# Patient Record
Sex: Female | Born: 1965 | Race: White | Hispanic: No | Marital: Married | Smoking: Never smoker
Health system: Southern US, Community
[De-identification: ages and names within clinical notes are randomized; demographics above are authoritative.]

## PROBLEM LIST (undated history)

## (undated) DIAGNOSIS — D649 Anemia, unspecified: Secondary | ICD-10-CM

## (undated) DIAGNOSIS — N200 Calculus of kidney: Secondary | ICD-10-CM

## (undated) DIAGNOSIS — Z87442 Personal history of urinary calculi: Secondary | ICD-10-CM

## (undated) DIAGNOSIS — R519 Headache, unspecified: Secondary | ICD-10-CM

## (undated) DIAGNOSIS — J45909 Unspecified asthma, uncomplicated: Secondary | ICD-10-CM

## (undated) DIAGNOSIS — M069 Rheumatoid arthritis, unspecified: Secondary | ICD-10-CM

## (undated) DIAGNOSIS — L4050 Arthropathic psoriasis, unspecified: Secondary | ICD-10-CM

## (undated) DIAGNOSIS — F419 Anxiety disorder, unspecified: Secondary | ICD-10-CM

## (undated) DIAGNOSIS — D472 Monoclonal gammopathy: Secondary | ICD-10-CM

## (undated) HISTORY — PX: ABDOMINAL HYSTERECTOMY: SHX81

## (undated) HISTORY — DX: Calculus of kidney: N20.0

## (undated) HISTORY — DX: Monoclonal gammopathy: D47.2

## (undated) HISTORY — DX: Rheumatoid arthritis, unspecified: M06.9

## (undated) HISTORY — PX: CHOLECYSTECTOMY: SHX55

## (undated) HISTORY — PX: BREAST BIOPSY: SHX20

---

## 2005-04-15 ENCOUNTER — Ambulatory Visit: Payer: Self-pay | Admitting: Unknown Physician Specialty

## 2006-04-16 ENCOUNTER — Ambulatory Visit: Payer: Self-pay | Admitting: Unknown Physician Specialty

## 2006-12-21 ENCOUNTER — Ambulatory Visit: Payer: Self-pay | Admitting: General Surgery

## 2007-04-20 ENCOUNTER — Ambulatory Visit: Payer: Self-pay | Admitting: Unknown Physician Specialty

## 2007-12-04 ENCOUNTER — Emergency Department: Payer: Self-pay | Admitting: Emergency Medicine

## 2008-04-20 ENCOUNTER — Ambulatory Visit: Payer: Self-pay | Admitting: Unknown Physician Specialty

## 2011-11-10 ENCOUNTER — Ambulatory Visit: Payer: Self-pay | Admitting: Internal Medicine

## 2011-11-11 ENCOUNTER — Ambulatory Visit: Payer: Self-pay | Admitting: Internal Medicine

## 2012-06-10 ENCOUNTER — Ambulatory Visit: Payer: Self-pay | Admitting: Internal Medicine

## 2012-10-27 ENCOUNTER — Other Ambulatory Visit: Payer: Self-pay | Admitting: Cardiology

## 2012-12-21 ENCOUNTER — Ambulatory Visit: Payer: Self-pay | Admitting: Internal Medicine

## 2014-01-24 ENCOUNTER — Ambulatory Visit: Payer: Self-pay | Admitting: Internal Medicine

## 2015-05-21 ENCOUNTER — Other Ambulatory Visit: Payer: Self-pay | Admitting: Internal Medicine

## 2015-05-21 DIAGNOSIS — Z1231 Encounter for screening mammogram for malignant neoplasm of breast: Secondary | ICD-10-CM

## 2015-06-05 ENCOUNTER — Ambulatory Visit
Admission: RE | Admit: 2015-06-05 | Discharge: 2015-06-05 | Disposition: A | Discharge status: Home / Self Care | Payer: BC Managed Care – PPO | Source: Ambulatory Visit | Attending: Internal Medicine | Admitting: Internal Medicine

## 2015-06-05 DIAGNOSIS — Z1231 Encounter for screening mammogram for malignant neoplasm of breast: Secondary | ICD-10-CM | POA: Insufficient documentation

## 2016-10-30 ENCOUNTER — Other Ambulatory Visit: Payer: Self-pay | Admitting: Internal Medicine

## 2016-10-30 DIAGNOSIS — Z1231 Encounter for screening mammogram for malignant neoplasm of breast: Secondary | ICD-10-CM

## 2016-11-21 ENCOUNTER — Ambulatory Visit
Admission: RE | Admit: 2016-11-21 | Discharge: 2016-11-21 | Disposition: A | Discharge status: Home / Self Care | Payer: BC Managed Care – PPO | Source: Ambulatory Visit | Attending: Internal Medicine | Admitting: Internal Medicine

## 2016-11-21 DIAGNOSIS — Z1231 Encounter for screening mammogram for malignant neoplasm of breast: Secondary | ICD-10-CM | POA: Diagnosis not present

## 2017-12-24 ENCOUNTER — Other Ambulatory Visit: Payer: Self-pay | Admitting: Internal Medicine

## 2017-12-24 DIAGNOSIS — Z1231 Encounter for screening mammogram for malignant neoplasm of breast: Secondary | ICD-10-CM

## 2019-03-24 ENCOUNTER — Other Ambulatory Visit: Payer: Self-pay | Admitting: Internal Medicine

## 2019-03-24 DIAGNOSIS — Z1231 Encounter for screening mammogram for malignant neoplasm of breast: Secondary | ICD-10-CM

## 2019-05-02 ENCOUNTER — Inpatient Hospital Stay: Admission: RE | Admit: 2019-05-02 | Payer: BC Managed Care – PPO | Source: Ambulatory Visit

## 2019-07-15 ENCOUNTER — Other Ambulatory Visit: Payer: Self-pay | Admitting: Internal Medicine

## 2019-07-15 DIAGNOSIS — Z1231 Encounter for screening mammogram for malignant neoplasm of breast: Secondary | ICD-10-CM

## 2019-07-15 DIAGNOSIS — R102 Pelvic and perineal pain: Secondary | ICD-10-CM

## 2019-07-22 ENCOUNTER — Ambulatory Visit
Admission: RE | Admit: 2019-07-22 | Discharge: 2019-07-22 | Disposition: A | Discharge status: Home / Self Care | Payer: BC Managed Care – PPO | Source: Ambulatory Visit | Attending: Internal Medicine | Admitting: Internal Medicine

## 2019-07-22 ENCOUNTER — Other Ambulatory Visit: Payer: Self-pay

## 2019-07-22 DIAGNOSIS — R102 Pelvic and perineal pain: Secondary | ICD-10-CM

## 2019-08-29 ENCOUNTER — Ambulatory Visit
Admission: RE | Admit: 2019-08-29 | Discharge: 2019-08-29 | Disposition: A | Discharge status: Home / Self Care | Payer: BC Managed Care – PPO | Source: Ambulatory Visit | Attending: Internal Medicine | Admitting: Internal Medicine

## 2019-08-29 DIAGNOSIS — Z1231 Encounter for screening mammogram for malignant neoplasm of breast: Secondary | ICD-10-CM | POA: Diagnosis present

## 2020-01-04 ENCOUNTER — Other Ambulatory Visit: Payer: Self-pay | Admitting: Rheumatology

## 2020-01-04 DIAGNOSIS — G8929 Other chronic pain: Secondary | ICD-10-CM

## 2020-01-04 DIAGNOSIS — M7061 Trochanteric bursitis, right hip: Secondary | ICD-10-CM

## 2020-01-19 ENCOUNTER — Other Ambulatory Visit: Payer: Self-pay

## 2020-01-19 ENCOUNTER — Ambulatory Visit
Admission: RE | Admit: 2020-01-19 | Discharge: 2020-01-19 | Disposition: A | Discharge status: Home / Self Care | Payer: BC Managed Care – PPO | Source: Ambulatory Visit | Attending: Rheumatology | Admitting: Rheumatology

## 2020-01-19 DIAGNOSIS — M545 Low back pain, unspecified: Secondary | ICD-10-CM

## 2020-01-19 DIAGNOSIS — M7061 Trochanteric bursitis, right hip: Secondary | ICD-10-CM

## 2020-01-19 DIAGNOSIS — G8929 Other chronic pain: Secondary | ICD-10-CM

## 2020-02-02 ENCOUNTER — Emergency Department
Admission: EM | Admit: 2020-02-02 | Discharge: 2020-02-02 | Disposition: A | Discharge status: Home / Self Care | Payer: BC Managed Care – PPO | Attending: Emergency Medicine | Admitting: Emergency Medicine

## 2020-02-02 ENCOUNTER — Other Ambulatory Visit: Payer: Self-pay

## 2020-02-02 ENCOUNTER — Encounter: Payer: Self-pay | Admitting: Emergency Medicine

## 2020-02-02 ENCOUNTER — Emergency Department: Payer: BC Managed Care – PPO

## 2020-02-02 DIAGNOSIS — N200 Calculus of kidney: Secondary | ICD-10-CM | POA: Diagnosis not present

## 2020-02-02 DIAGNOSIS — J45909 Unspecified asthma, uncomplicated: Secondary | ICD-10-CM | POA: Insufficient documentation

## 2020-02-02 DIAGNOSIS — R109 Unspecified abdominal pain: Secondary | ICD-10-CM | POA: Diagnosis present

## 2020-02-02 HISTORY — DX: Unspecified asthma, uncomplicated: J45.909

## 2020-02-02 LAB — CBC
HCT: 35.6 % — ABNORMAL LOW (ref 36.0–46.0)
Hemoglobin: 12.5 g/dL (ref 12.0–15.0)
MCH: 35.7 pg — ABNORMAL HIGH (ref 26.0–34.0)
MCHC: 35.1 g/dL (ref 30.0–36.0)
MCV: 101.7 fL — ABNORMAL HIGH (ref 80.0–100.0)
Platelets: 144 10*3/uL — ABNORMAL LOW (ref 150–400)
RBC: 3.5 MIL/uL — ABNORMAL LOW (ref 3.87–5.11)
RDW: 13.9 % (ref 11.5–15.5)
WBC: 7.5 10*3/uL (ref 4.0–10.5)
nRBC: 0 % (ref 0.0–0.2)

## 2020-02-02 LAB — URINALYSIS, COMPLETE (UACMP) WITH MICROSCOPIC
Bilirubin Urine: NEGATIVE
Glucose, UA: NEGATIVE mg/dL
Ketones, ur: 5 mg/dL — AB
Leukocytes,Ua: NEGATIVE
Nitrite: NEGATIVE
Protein, ur: NEGATIVE mg/dL
Specific Gravity, Urine: 1.006 (ref 1.005–1.030)
pH: 6 (ref 5.0–8.0)

## 2020-02-02 LAB — BASIC METABOLIC PANEL
Anion gap: 8 (ref 5–15)
BUN: 17 mg/dL (ref 6–20)
CO2: 26 mmol/L (ref 22–32)
Calcium: 9 mg/dL (ref 8.9–10.3)
Chloride: 103 mmol/L (ref 98–111)
Creatinine, Ser: 0.78 mg/dL (ref 0.44–1.00)
GFR calc Af Amer: >60 mL/min (ref >60)
GFR calc non Af Amer: >60 mL/min (ref >60)
Glucose, Bld: 98 mg/dL (ref 70–99)
Potassium: 4.7 mmol/L (ref 3.5–5.1)
Sodium: 137 mmol/L (ref 135–145)

## 2020-02-02 MED ORDER — OXYCODONE-ACETAMINOPHEN 5-325 MG PO TABS
1.0000 | ORAL_TABLET | Freq: Once | ORAL | Status: AC
  Administered 2020-02-02: 1 via ORAL
  Filled 2020-02-02: qty 1

## 2020-02-02 MED ORDER — ONDANSETRON 4 MG PO TBDP: 4.0000 mg | ORAL_TABLET | Freq: Three times a day (TID) | ORAL | 0 refills | Status: DC | PRN

## 2020-02-02 MED ORDER — OXYCODONE-ACETAMINOPHEN 5-325 MG PO TABS: 1.0000 | ORAL_TABLET | ORAL | 0 refills | Status: AC | PRN

## 2020-02-02 MED ORDER — IBUPROFEN 600 MG PO TABS
600.0000 mg | ORAL_TABLET | Freq: Once | ORAL | Status: AC | PRN
  Administered 2020-02-02: 600 mg via ORAL
  Filled 2020-02-02: qty 1

## 2020-02-02 NOTE — ED Triage Notes (Signed)
Pt in via POV, sent from PCP office due to positive CT scan for obstructing kidney stone.  Pt reports scan was done [facility].    Reports right flank pain x one day, w/ N/V.  Reports hx of same.  Vitals WDL, NAD noted at this time.

## 2020-02-02 NOTE — ED Provider Notes (Signed)
[facility] Emergency Department Provider Note   ____________________________________________   First MD Initiated Contact with Patient 02/02/20 2056     (approximate)  I have reviewed the triage vital signs and the nursing notes.   HISTORY  Chief Complaint Nephrolithiasis    HPI Cassandra Carr is a 54 y.o. female with past medical history of asthma and arthritis who presents to the ED complaining of flank pain.  Patient reports that she started having sharp pain in her right flank radiating towards her groin yesterday.  Pain has been intermittent since then but became severe today.  It was associated with nausea and an episode of vomiting earlier today.  She also noticed a significant amount of blood in her urine earlier today, but she denies any fevers or dysuria.  She has previously been told she has kidney stones, but has never had similar symptoms.  She was seen in her PCPs office and had an outpatient CT scan that showed obstructing nephrolithiasis.  She was subsequently sent to the ED for further evaluation.        Past Medical History:  Diagnosis Date  . Arthritis   . Asthma     There are no problems to display for this patient.   Past Surgical History:  Procedure Laterality Date  . BREAST BIOPSY Left    negative 06/2012    Prior to Admission medications   Medication Sig Start Date End Date Taking? Authorizing Provider  ondansetron (ZOFRAN ODT) 4 MG disintegrating tablet Take 1 tablet (4 mg total) by mouth every 8 (eight) hours as needed for nausea or vomiting. 02/02/20   Blake Divine, MD  oxyCODONE-acetaminophen (PERCOCET) 5-325 MG tablet Take 1 tablet by mouth every 4 (four) hours as needed for severe pain. 02/02/20 02/01/21  Blake Divine, MD    Allergies Patient has no known allergies.  Family History  Problem Relation Age of Onset  . Breast cancer Neg Hx     Social History Social History   Tobacco Use  . Smoking  status: Never Smoker  . Smokeless tobacco: Never Used  Vaping Use  . Vaping Use: Never used  Substance Use Topics  . Alcohol use: Never  . Drug use: Never    Review of Systems  Constitutional: No fever/chills Eyes: No visual changes. ENT: No sore throat. Cardiovascular: Denies chest pain. Respiratory: Denies shortness of breath. Gastrointestinal: Positive for flank and abdominal pain.  Positive for nausea and vomiting.  No diarrhea.  No constipation. Genitourinary: Negative for dysuria. Musculoskeletal: Negative for back pain. Skin: Negative for rash. Neurological: Negative for headaches, focal weakness or numbness.  ____________________________________________   PHYSICAL EXAM:  VITAL SIGNS: ED Triage Vitals  Enc Vitals Group     BP 02/02/20 1644 (!) 148/77     Pulse Rate 02/02/20 1644 100     Resp 02/02/20 1644 17     Temp 02/02/20 1644 98.5 F (36.9 C)     Temp Source 02/02/20 1644 Oral     SpO2 02/02/20 1644 99 %     Weight 02/02/20 1645 130 lb (59 kg)     Height 02/02/20 1645 5\' 1"  (1.549 m)     Head Circumference --      Peak Flow --      Pain Score 02/02/20 1645 8     Pain Loc --      Pain Edu? --      Excl. in [place]? --     Constitutional: Alert and oriented.  Eyes: Conjunctivae are normal. Head: Atraumatic. Nose: No congestion/rhinnorhea. Mouth/Throat: Mucous membranes are moist. Neck: Normal ROM Cardiovascular: Normal rate, regular rhythm. Grossly normal heart sounds. Respiratory: Normal respiratory effort.  No retractions. Lungs CTAB. Gastrointestinal: Soft and nontender.  No CVA tenderness bilaterally.  No distention. Genitourinary: deferred Musculoskeletal: No lower extremity tenderness nor edema. Neurologic:  Normal speech and language. No gross focal neurologic deficits are appreciated. Skin:  Skin is warm, dry and intact. No rash noted. Psychiatric: Mood and affect are normal. Speech and behavior are  normal.  ____________________________________________   LABS (all labs ordered are listed, but only abnormal results are displayed)  Labs Reviewed  CBC - Abnormal; Notable for the following components:      Result Value   RBC 3.50 (*)    HCT 35.6 (*)    MCV 101.7 (*)    MCH 35.7 (*)    Platelets 144 (*)    All other components within normal limits  URINALYSIS, COMPLETE (UACMP) WITH MICROSCOPIC - Abnormal; Notable for the following components:   Color, Urine STRAW (*)    APPearance CLEAR (*)    Hgb urine dipstick LARGE (*)    Ketones, ur 5 (*)    Bacteria, UA RARE (*)    All other components within normal limits  BASIC METABOLIC PANEL     PROCEDURES  Procedure(s) performed (including Critical Care):  Procedures   ____________________________________________   INITIAL IMPRESSION / ASSESSMENT AND PLAN / ED COURSE       54 year old female with past medical history of asthma and arthritis presents to the ED complaining of approximately 24 hours of intermittent right flank pain radiating towards her groin with associated hematuria.  She had outpatient CT scan that reportedly showed obstructing nephrolithiasis, however I am unable to see these images in epic and the office where imaging was done and is now closed.  We will need to repeat CT scan to ensure no other acute pathology requiring urologic intervention.  Lab work thus far is reassuring, UA shows no evidence of infection.  Her pain is improved from earlier today but we will treat some additional ongoing pain with Percocet.  CT scan shows 9 mm right-sided UVJ stone with no other acute findings.  Patient's pain is much improved following dose of Percocet and she is appropriate for discharge home with urology follow-up.  She was counseled that she will need to closely follow-up with urology given the large size of her stone and that she should also return to the ED for any new or worsening symptoms.  Patient agrees with  plan.      ____________________________________________   FINAL CLINICAL IMPRESSION(S) / ED DIAGNOSES  Final diagnoses:  Nephrolithiasis     ED Discharge Orders         Ordered    oxyCODONE-acetaminophen (PERCOCET) 5-325 MG tablet  Every 4 hours PRN     Discontinue  Reprint     02/02/20 2249    ondansetron (ZOFRAN ODT) 4 MG disintegrating tablet  Every 8 hours PRN     Discontinue  Reprint     02/02/20 2249           Note:  This document was prepared using Dragon voice recognition software and may include unintentional dictation errors.   Blake Divine, MD 02/02/20 2250

## 2020-02-02 NOTE — ED Notes (Signed)
Pt requesting medication for headache

## 2020-02-02 NOTE — ED Notes (Signed)
Pt transported to CT ?

## 2020-02-02 NOTE — ED Notes (Signed)
Pt states that she was sent in for an obstructive kidney stone. Patient states having pain on her right side and some nausea and blood in urine. Pt states she also threw up. Patient denies fevers.

## 2020-02-03 ENCOUNTER — Inpatient Hospital Stay
Admission: RE | Admit: 2020-02-03 | Discharge: 2020-02-03 | Disposition: A | Discharge status: Home / Self Care | Payer: BC Managed Care – PPO | Source: Ambulatory Visit

## 2020-02-03 ENCOUNTER — Ambulatory Visit
Admission: RE | Admit: 2020-02-03 | Discharge: 2020-02-03 | Disposition: A | Discharge status: Home / Self Care | Payer: BC Managed Care – PPO | Attending: Urology | Admitting: Urology

## 2020-02-03 ENCOUNTER — Other Ambulatory Visit
Admission: RE | Admit: 2020-02-03 | Discharge: 2020-02-03 | Disposition: A | Discharge status: Home / Self Care | Payer: BC Managed Care – PPO | Source: Ambulatory Visit | Admitting: Urology

## 2020-02-03 ENCOUNTER — Ambulatory Visit (INDEPENDENT_AMBULATORY_CARE_PROVIDER_SITE_OTHER): Payer: BC Managed Care – PPO | Admitting: Physician Assistant

## 2020-02-03 ENCOUNTER — Ambulatory Visit
Admission: RE | Admit: 2020-02-03 | Discharge: 2020-02-03 | Disposition: A | Discharge status: Home / Self Care | Payer: BC Managed Care – PPO | Source: Ambulatory Visit | Attending: Urology | Admitting: Urology

## 2020-02-03 ENCOUNTER — Encounter: Payer: Self-pay | Admitting: Physician Assistant

## 2020-02-03 ENCOUNTER — Other Ambulatory Visit: Payer: Self-pay | Admitting: Urology

## 2020-02-03 ENCOUNTER — Telehealth: Payer: Self-pay | Admitting: Urology

## 2020-02-03 ENCOUNTER — Encounter
Admission: RE | Admit: 2020-02-03 | Discharge: 2020-02-03 | Disposition: A | Discharge status: Home / Self Care | Payer: BC Managed Care – PPO | Source: Ambulatory Visit | Attending: Urology | Admitting: Urology

## 2020-02-03 VITALS — BP 147/80 | HR 88 | Ht 61.0 in

## 2020-02-03 DIAGNOSIS — Z01812 Encounter for preprocedural laboratory examination: Secondary | ICD-10-CM | POA: Diagnosis not present

## 2020-02-03 DIAGNOSIS — N201 Calculus of ureter: Secondary | ICD-10-CM

## 2020-02-03 DIAGNOSIS — N200 Calculus of kidney: Secondary | ICD-10-CM | POA: Insufficient documentation

## 2020-02-03 DIAGNOSIS — M069 Rheumatoid arthritis, unspecified: Secondary | ICD-10-CM | POA: Insufficient documentation

## 2020-02-03 DIAGNOSIS — Z20822 Contact with and (suspected) exposure to covid-19: Secondary | ICD-10-CM | POA: Diagnosis not present

## 2020-02-03 HISTORY — DX: Anxiety disorder, unspecified: F41.9

## 2020-02-03 HISTORY — DX: Headache, unspecified: R51.9

## 2020-02-03 HISTORY — DX: Personal history of urinary calculi: Z87.442

## 2020-02-03 HISTORY — DX: Anemia, unspecified: D64.9

## 2020-02-03 LAB — URINALYSIS, COMPLETE
Bilirubin, UA: NEGATIVE
Glucose, UA: NEGATIVE
Leukocytes,UA: NEGATIVE
Nitrite, UA: NEGATIVE
Specific Gravity, UA: >1.03 — ABNORMAL HIGH (ref 1.005–1.030)
Urobilinogen, Ur: 0.2 mg/dL (ref 0.2–1.0)
pH, UA: 5.5 (ref 5.0–7.5)

## 2020-02-03 LAB — MICROSCOPIC EXAMINATION: RBC, Urine: >30 /hpf — AB (ref 0–2)

## 2020-02-03 LAB — SARS CORONAVIRUS 2 (TAT 6-24 HRS): SARS Coronavirus 2: NEGATIVE

## 2020-02-03 MED ORDER — KETOROLAC TROMETHAMINE 60 MG/2ML IM SOLN
60.0000 mg | Freq: Once | INTRAMUSCULAR | Status: AC
  Administered 2020-02-03: 60 mg via INTRAMUSCULAR

## 2020-02-03 MED ORDER — TAMSULOSIN HCL 0.4 MG PO CAPS: 0.4000 mg | ORAL_CAPSULE | Freq: Every day | ORAL | 0 refills | Status: DC

## 2020-02-03 NOTE — Progress Notes (Signed)
02/03/2020 11:19 AM   Denzil Hughes 12/10/1965 [id number]  CC: Chief Complaint  Patient presents with  . Nephrolithiasis   HPI: Cassandra WOODMANSEE is a 54 y.o. female who presents today for same-day add-on to discuss management of a 9 mm proximal right ureteral stone.  She presented to the ED yesterday with reports of a 1 day history of right flank pain, nausea, and vomiting.  Creatinine and WBC count WNL. She received Percocet and ibuprofen 600 mg in the ED and was discharged with Zofran and Percocet for pain control but has not yet filled these.  Today she reports severe right flank pain without nausea and vomiting. She states the pain had eased off by the time she received pain medication in the ED such that she is unsure if it helped her symptoms.  She reports an episode of likely pyelonephritis in 2015 with incidental findings of nephrolithiasis on CT at that time; she is unsure if she has ever passed a stone and has not undergone prior urologic surgery for stone management.  PMH notable for low bone density on Fosamax, rheumatoid arthritis on methotrexate, and mild asthma on once daily Flovent; she denies recent exacerbations and states she has not had an asthma exacerbation in many years.  She is not on anticoagulation and denies cardiac problems.  CT stone study dated yesterday reveals a 9 mm proximal right ureteral stone with mild right hydronephrosis, also with bilateral nonobstructing nephrolithiasis R>L.  Stone measures <1000HU in density, skin-to-stone distance approximately 11cm.  Stone is visualizable on KUB this morning and appears to have migrated slightly distally compared to prior CT.  In-office UA today positive for 1+ ketones, 3+ blood, and 2+ protein; urine microscopy with 0-5 WBCs/HPF, >30 RBCs/HPF, and few bacteria.  PMH: Past Medical History:  Diagnosis Date  . Asthma   . Rheumatoid arthritis [facility])     Surgical History: Past Surgical History:  Procedure  Laterality Date  . BREAST BIOPSY Left    negative 06/2012    Home Medications:  Allergies as of 02/03/2020   No Known Allergies     Medication List       Accurate as of February 03, 2020 11:19 AM. If you have any questions, ask your nurse or doctor.        alendronate 70 MG tablet Commonly known as: FOSAMAX Take 70 mg by mouth once a week.   ALPRAZolam 0.25 MG tablet Commonly known as: XANAX Take 0.25 mg by mouth daily as needed.   celecoxib 200 MG capsule Commonly known as: CELEBREX Take by mouth.   cloNIDine 0.3 MG tablet Commonly known as: CATAPRES   Cosentyx Sensoready (300 MG) 150 MG/ML Soaj Generic drug: Secukinumab (300 MG Dose) Inject 2 Syringes into the skin every 28 (twenty-eight) days.   folic acid 1 MG tablet Commonly known as: FOLVITE Take by mouth.   gabapentin 100 MG capsule Commonly known as: NEURONTIN   methotrexate 2.5 MG tablet SMARTSIG:5 Tablet(s) By Mouth Once a Week   ondansetron 4 MG disintegrating tablet Commonly known as: Zofran ODT Take 1 tablet (4 mg total) by mouth every 8 (eight) hours as needed for nausea or vomiting.   oxyCODONE-acetaminophen 5-325 MG tablet Commonly known as: Percocet Take 1 tablet by mouth every 4 (four) hours as needed for severe pain.   sertraline 100 MG tablet Commonly known as: ZOLOFT   SUMAtriptan 50 MG tablet Commonly known as: IMITREX   tamsulosin 0.4 MG Caps capsule Commonly known as:  FLOMAX Take 1 capsule (0.4 mg total) by mouth daily. Started by: Debroah Loop, PA-C   tiZANidine 4 MG tablet Commonly known as: ZANAFLEX Take by mouth.   Vitamin D (Ergocalciferol) 1.25 MG (50000 UNIT) Caps capsule Commonly known as: DRISDOL Take 50,000 Units by mouth once a week.       Allergies:  No Known Allergies  Family History: Family History  Problem Relation Age of Onset  . Breast cancer Neg Hx     Social History:   reports that she has never smoked. She has never used smokeless  tobacco. She reports that she does not drink alcohol and does not use drugs.  Physical Exam: BP (!) 147/80   Pulse 88   Ht _0  (1.549 m)   BMI 24.56 kg/m   Constitutional:  Alert and oriented, uncomfortable appearing and gripping her right flank, nontoxic appearing HEENT: [place], AT Cardiovascular: No clubbing, cyanosis, or edema Respiratory: Normal respiratory effort, no increased work of breathing Skin: No rashes, bruises or suspicious lesions Neurologic: Grossly intact, no focal deficits, moving all 4 extremities Psychiatric: Normal mood and affect  Laboratory Data: Results for orders placed or performed in visit on 02/03/20  CULTURE, URINE COMPREHENSIVE   Specimen: Urine   UR  Result Value Ref Range   Urine Culture, Comprehensive Final report (A)    Organism ID, Bacteria Comment (A)    Organism ID, Bacteria Enterococcus faecalis (A)    Organism ID, Bacteria Comment    ANTIMICROBIAL SUSCEPTIBILITY Comment   Microscopic Examination   Urine  Result Value Ref Range   WBC, UA 0-5 0 - 5 /hpf   RBC >30 (A) 0 - 2 /hpf   Epithelial Cells (non renal) 0-10 0 - 10 /hpf   Bacteria, UA Few None seen/Few  Urinalysis, Complete  Result Value Ref Range   Specific Gravity, UA >1.030 (H) 1.005 - 1.030   pH, UA 5.5 5.0 - 7.5   Color, UA Yellow Yellow   Appearance Ur Cloudy (A) Clear   Leukocytes,UA Negative Negative   Protein,UA 2+ (A) Negative/Trace   Glucose, UA Negative Negative   Ketones, UA 1+ (A) Negative   RBC, UA 3+ (A) Negative   Bilirubin, UA Negative Negative   Urobilinogen, Ur 0.2 0.2 - 1.0 mg/dL   Nitrite, UA Negative Negative   Microscopic Examination See below:    Pertinent Imaging: KUB, 02/03/2020: CLINICAL DATA:  Right kidney stone, follow-up  EXAM: ABDOMEN - 1 VIEW  COMPARISON:  Correlation made with CT from 02/02/2020  FINDINGS: Right proximal ureteral calculus on the prior study near the level of the L3 transverse process is not definitely identified.  Possible increased density inferior to the transverse process could represent the stone. Punctate calculus of the right upper pole is present.  IMPRESSION: Right ureteral calculus is not definitely identified but may have moved slightly distally.  Punctate right upper pole renal calculus.   Electronically Signed   By: Macy Mis M.D.   On: 02/03/2020 15:26  Results for orders placed during the hospital encounter of 02/02/20  CT Renal Stone Study  Narrative CLINICAL DATA:  Right-sided flank pain for 1 day, history of recent CT with obstruction  EXAM: CT ABDOMEN AND PELVIS WITHOUT CONTRAST  TECHNIQUE: Multidetector CT imaging of the abdomen and pelvis was performed following the standard protocol without IV contrast.  COMPARISON:  None.  FINDINGS: Lower chest: No acute abnormality.  Hepatobiliary: No focal liver abnormality is seen. Status post cholecystectomy. No biliary dilatation.  Pancreas: Unremarkable. No pancreatic ductal dilatation or surrounding inflammatory changes.  Spleen: Normal in size without focal abnormality.  Adrenals/Urinary Tract: Adrenal glands are within normal limits. Kidneys are well visualized bilaterally. Renal cyst is noted in the left kidney measuring 2 cm. No obstructive changes are noted on the left. Tiny nonobstructing stones are noted on the left best seen on the coronal plane. Nonobstructing renal stone is noted on the right in the upper pole measuring 2-3 mm. Fullness of the collecting system and proximal ureter is noted secondary to a 9 mm right UVJ stone. More distal right ureter is within normal limits. The bladder is unremarkable.  Stomach/Bowel: Scattered diverticular change of the colon is noted without diverticulitis. No obstructive or inflammatory changes are seen. The appendix is within normal limits. Small bowel and stomach are unremarkable.  Vascular/Lymphatic: Aortic atherosclerosis. No enlarged abdominal  or pelvic lymph nodes.  Reproductive: Status post hysterectomy. No adnexal masses.  Other: No abdominal wall hernia or abnormality. No abdominopelvic ascites.  Musculoskeletal: Degenerative changes of lumbar spine are  IMPRESSION: 9 mm proximal right UVJ stone with obstructive change.  Small nonobstructing stones bilaterally.  Diverticulosis without diverticulitis.   Electronically Signed By: Inez Catalina M.D. On: 02/02/2020 21:52   I personally reviewed the images referenced above and note a 9 mm proximal right ureteral stone with mild right hydronephrosis.  Assessment & Plan:   54 year old female with mild asthma on daily Flovent, rheumatoid arthritis on methotrexate, and low bone density on Fosamax presents with an obstructing 9 mm proximal right ureteral stone.  VSS this morning, UA reassuring for infection.  I had a lengthy conversation with the patient and her husband today regarding her treatment options at this point.  We discussed various treatment options for her stone including trial of passage vs. ESWL vs. ureteroscopy with laser lithotripsy and stent. We discussed the risks and benefits of each including bleeding, infection, damage to surrounding structures, efficacy with need for possible further intervention, and need for temporary ureteral stent.  Based on his conversation, patient has decided to proceed with ureteroscopy with laser lithotripsy and stent placement with Dr. Diamantina Providence on 02/06/2020.  I counseled the patient that if she develops uncontrollable pain, nausea, vomiting, or new fever or chills in the interim, she should proceed to the emergency room.  She expressed understanding.  1. Right ureteral stone Booking sheet completed today for planned URS/LL/stent placement with Dr. Diamantina Providence in 3 days.  No medical clearance required.  UA reassuring for infection, will send for culture for further evaluation.  Prescribing Flomax for MET in the interim.  60 mg IM  Toradol administered in clinic today with improvement in pain.  Counseled patient to continue Zofran and Percocet as prescribed by the emergency department.  Will defer prescribing additional NSAIDs given that she already takes Celebrex daily and is on SSRIs, increasing her bleeding risk.  Counseled her to hold Celebrex today.  She expressed understanding. - CULTURE, URINE COMPREHENSIVE - Urinalysis, Complete - ketorolac (TORADOL) injection 60 mg - tamsulosin (FLOMAX) 0.4 MG CAPS capsule; Take 1 capsule (0.4 mg total) by mouth daily.  Dispense: 30 capsule; Refill: 0   Return if symptoms worsen or fail to improve.  Debroah Loop, PA-C  [facility] [address] [place], [place] [address] [phone]

## 2020-02-03 NOTE — Patient Instructions (Addendum)
Your procedure is scheduled IO:EVOJJK 8/2  Report to Day Surgery. At 8:30   Remember: Instructions that are not followed completely may result in serious medical risk,  up to and including death, or upon the discretion of your surgeon and anesthesiologist your  surgery may need to be rescheduled.     _X__ 1. Do not eat food after midnight the night before your procedure.                 No gum chewing or hard candies. You may drink clear liquids up to 2 hours                 before you are scheduled to arrive for your surgery- DO not drink clear                 liquids within 2 hours of the start of your surgery.                 Clear Liquids include:  water, apple juice without pulp, clear Gatorade, G2 or                  Gatorade Zero (avoid Red/Purple/Blue), Black Coffee or Tea (Do not add                 anything to coffee or tea). _____2.   Complete the carbohydrate drink provided to you, 2 hours before arrival.  __X__2.  On the morning of surgery brush your teeth with toothpaste and water, you                may rinse your mouth with mouthwash if you wish.  Do not swallow any toothpaste of mouthwash.     ___ 3.  No Alcohol for 24 hours before or after surgery.   ___ 4.  Do Not Smoke or use e-cigarettes For 24 Hours Prior to Your Surgery.                 Do not use any chewable tobacco products for at least 6 hours prior to                 Surgery.  ___  5.  Do not use any recreational drugs (marijuana, cocaine, heroin, ecstacy, MDMA or other)                For at least one week prior to your surgery.  Combination of these drugs with anesthesia                May have life threatening results.  ____  6.  Bring all medications with you on the day of surgery if instructed.   __x__  7.  Notify your doctor if there is any change in your medical condition      (cold, fever, infections).     Do not wear jewelry, make-up, hairpins, clips or nail polish. Do  not wear lotions, powders, or perfumes. You may wear deodorant. Do not shave 48 hours prior to surgery. Do not bring valuables to the hospital.    [facility] is not responsible for any belongings or valuables.  Contacts, dentures or bridgework may not be worn into surgery. Leave your suitcase in the car. After surgery it may be brought to your room. For patients admitted to the hospital, discharge time is determined by your treatment team.   Patients discharged the day of surgery will not be allowed to drive home.   Make arrangements  for someone to be with you for the first 24 hours of your Same Day Discharge.    Please read over the following fact sheets that you were given:    __x__ Take these medicines the morning of surgery with A SIP OF WATER:    1.ALPRAZolam (XANAX) 0.25 MG tablet if needed  2. celecoxib (CELEBREX) 200 MG capsule  3. gabapentin (NEURONTIN) 100 MG capsule  4.oxyCODONE-acetaminophen (PERCOCET) 5-325 MG tablet if needed  5.sertraline (ZOLOFT) 100 MG tablet  6.tamsulosin (FLOMAX) 0.4 MG CAPS capsule              7.tiZANidine (ZANAFLEX) 4 MG tablet ____ Fleet Enema (as directed)   __x__ shower the night before and the morning of surgery  ____ Use Benzoyl Peroxide Gel as instructed  __x__ Use inhalers on the day of surgeryfluticasone (FLOVENT DISKUS) 50 MCG/BLIST diskus inhaler  ____ Stop metformin 2 days prior to surgery    ____ Take 1/2 of usual insulin dose the night before surgery. No insulin the morning          of surgery.   ____ Stop Coumadin/Plavix/aspirin on   __x__ Stop Anti-inflammatories no ibuprofen aleve or Excedrin until after surgery.  May take tylenol or Percocet   ____ Stop supplements until after surgery.    ____ Bring C-Pap to the hospital.

## 2020-02-03 NOTE — Telephone Encounter (Signed)
-----   Message from Billey Co, MD sent at 02/03/2020  7:05 AM EDT ----- Regarding: ER stone patient Please offer her a visit with me this Monday, 1pm would work, thanks. Needs KUB prior, I will order  Nickolas Madrid, MD 02/03/2020

## 2020-02-03 NOTE — Telephone Encounter (Signed)
App made 

## 2020-02-03 NOTE — Patient Instructions (Addendum)
1. You received one dose of Toradol (ketorolac) in clinic today. Do not take Celebrex today; you may restart it tomorrow. 2. I have also prescribed Flomax today to dilate your urinary passages; please start this once daily. 3. You may also take Zofran and Percocet as prescribed in the Emergency Department. 4. If you develop uncontrollable pain, nausea, or vomiting or new fever or chills over the weekend, please go to the Emergency Department.

## 2020-02-06 ENCOUNTER — Other Ambulatory Visit: Payer: Self-pay

## 2020-02-06 ENCOUNTER — Ambulatory Visit: Payer: BC Managed Care – PPO | Admitting: Anesthesiology

## 2020-02-06 ENCOUNTER — Ambulatory Visit: Payer: BC Managed Care – PPO

## 2020-02-06 ENCOUNTER — Ambulatory Visit
Admission: RE | Admit: 2020-02-06 | Discharge: 2020-02-06 | Disposition: A | Discharge status: Home / Self Care | Payer: BC Managed Care – PPO | Attending: Urology | Admitting: Urology

## 2020-02-06 ENCOUNTER — Encounter
Admission: RE | Disposition: A | Discharge status: Home / Self Care | Payer: Self-pay | Source: Home / Self Care | Attending: Urology

## 2020-02-06 ENCOUNTER — Telehealth: Payer: Self-pay | Admitting: Physician Assistant

## 2020-02-06 ENCOUNTER — Ambulatory Visit: Payer: Self-pay | Admitting: Urology

## 2020-02-06 ENCOUNTER — Encounter: Payer: Self-pay | Admitting: Urology

## 2020-02-06 DIAGNOSIS — Z87442 Personal history of urinary calculi: Secondary | ICD-10-CM | POA: Diagnosis not present

## 2020-02-06 DIAGNOSIS — N201 Calculus of ureter: Secondary | ICD-10-CM

## 2020-02-06 DIAGNOSIS — J45909 Unspecified asthma, uncomplicated: Secondary | ICD-10-CM | POA: Diagnosis not present

## 2020-02-06 DIAGNOSIS — M069 Rheumatoid arthritis, unspecified: Secondary | ICD-10-CM | POA: Diagnosis not present

## 2020-02-06 DIAGNOSIS — N202 Calculus of kidney with calculus of ureter: Secondary | ICD-10-CM | POA: Insufficient documentation

## 2020-02-06 DIAGNOSIS — N200 Calculus of kidney: Secondary | ICD-10-CM

## 2020-02-06 HISTORY — PX: CYSTOSCOPY/URETEROSCOPY/HOLMIUM LASER/STENT PLACEMENT: SHX6546

## 2020-02-06 LAB — CULTURE, URINE COMPREHENSIVE

## 2020-02-06 SURGERY — CYSTOSCOPY/URETEROSCOPY/HOLMIUM LASER/STENT PLACEMENT
Anesthesia: General | Laterality: Right

## 2020-02-06 MED ORDER — PHENYLEPHRINE HCL (PRESSORS) 10 MG/ML IV SOLN
INTRAVENOUS | Status: AC
  Filled 2020-02-06: qty 1

## 2020-02-06 MED ORDER — SUGAMMADEX SODIUM 200 MG/2ML IV SOLN
INTRAVENOUS | Status: DC | PRN
  Administered 2020-02-06: 118 mg via INTRAVENOUS

## 2020-02-06 MED ORDER — FENTANYL CITRATE (PF) 100 MCG/2ML IJ SOLN
INTRAMUSCULAR | Status: DC | PRN
  Administered 2020-02-06: 50 ug via INTRAVENOUS
  Administered 2020-02-06: 25 ug via INTRAVENOUS

## 2020-02-06 MED ORDER — ONDANSETRON HCL 4 MG/2ML IJ SOLN: 4.0000 mg | Freq: Once | INTRAMUSCULAR | Status: DC | PRN

## 2020-02-06 MED ORDER — KETOROLAC TROMETHAMINE 30 MG/ML IJ SOLN
INTRAMUSCULAR | Status: AC
  Filled 2020-02-06: qty 1

## 2020-02-06 MED ORDER — CEFAZOLIN SODIUM-DEXTROSE 2-4 GM/100ML-% IV SOLN
2.0000 g | INTRAVENOUS | Status: AC
  Administered 2020-02-06: 2 g via INTRAVENOUS

## 2020-02-06 MED ORDER — DEXAMETHASONE SODIUM PHOSPHATE 10 MG/ML IJ SOLN
INTRAMUSCULAR | Status: DC | PRN
  Administered 2020-02-06: 10 mg via INTRAVENOUS

## 2020-02-06 MED ORDER — GLYCOPYRROLATE 0.2 MG/ML IJ SOLN
INTRAMUSCULAR | Status: DC | PRN
  Administered 2020-02-06: .2 mg via INTRAVENOUS

## 2020-02-06 MED ORDER — ONDANSETRON HCL 4 MG/2ML IJ SOLN
INTRAMUSCULAR | Status: AC
  Filled 2020-02-06: qty 2

## 2020-02-06 MED ORDER — CHLORHEXIDINE GLUCONATE 0.12 % MT SOLN: 15.0000 mL | Freq: Once | OROMUCOSAL | Status: AC

## 2020-02-06 MED ORDER — ONDANSETRON HCL 4 MG/2ML IJ SOLN
INTRAMUSCULAR | Status: DC | PRN
  Administered 2020-02-06: 4 mg via INTRAVENOUS

## 2020-02-06 MED ORDER — ACETAMINOPHEN 10 MG/ML IV SOLN
INTRAVENOUS | Status: DC | PRN
  Administered 2020-02-06: 1000 mg via INTRAVENOUS

## 2020-02-06 MED ORDER — OXYCODONE-ACETAMINOPHEN 5-325 MG PO TABS: 1.0000 | ORAL_TABLET | ORAL | 0 refills | Status: AC | PRN

## 2020-02-06 MED ORDER — FENTANYL CITRATE (PF) 100 MCG/2ML IJ SOLN: 25.0000 ug | INTRAMUSCULAR | Status: DC | PRN

## 2020-02-06 MED ORDER — IOHEXOL 180 MG/ML  SOLN
INTRAMUSCULAR | Status: DC | PRN
  Administered 2020-02-06: 3540 mg

## 2020-02-06 MED ORDER — ROCURONIUM BROMIDE 100 MG/10ML IV SOLN
INTRAVENOUS | Status: DC | PRN
  Administered 2020-02-06: 40 mg via INTRAVENOUS

## 2020-02-06 MED ORDER — BELLADONNA ALKALOIDS-OPIUM 16.2-60 MG RE SUPP
RECTAL | Status: AC
  Filled 2020-02-06: qty 1

## 2020-02-06 MED ORDER — MIDAZOLAM HCL 2 MG/2ML IJ SOLN
INTRAMUSCULAR | Status: AC
  Filled 2020-02-06: qty 2

## 2020-02-06 MED ORDER — PROPOFOL 10 MG/ML IV BOLUS
INTRAVENOUS | Status: DC | PRN
  Administered 2020-02-06: 130 mg via INTRAVENOUS

## 2020-02-06 MED ORDER — FENTANYL CITRATE (PF) 100 MCG/2ML IJ SOLN
INTRAMUSCULAR | Status: AC
  Filled 2020-02-06: qty 2

## 2020-02-06 MED ORDER — LIDOCAINE HCL (PF) 2 % IJ SOLN
INTRAMUSCULAR | Status: AC
  Filled 2020-02-06: qty 5

## 2020-02-06 MED ORDER — PHENYLEPHRINE HCL (PRESSORS) 10 MG/ML IV SOLN
INTRAVENOUS | Status: DC | PRN
  Administered 2020-02-06: 100 ug via INTRAVENOUS
  Administered 2020-02-06: 200 ug via INTRAVENOUS

## 2020-02-06 MED ORDER — MIDAZOLAM HCL 2 MG/2ML IJ SOLN
INTRAMUSCULAR | Status: DC | PRN
  Administered 2020-02-06: 2 mg via INTRAVENOUS

## 2020-02-06 MED ORDER — OXYBUTYNIN CHLORIDE ER 10 MG PO TB24
10.0000 mg | ORAL_TABLET | Freq: Every day | ORAL | 0 refills | Status: AC | PRN
Start: 2020-02-06 — End: 2020-02-10

## 2020-02-06 MED ORDER — ACETAMINOPHEN 10 MG/ML IV SOLN
INTRAVENOUS | Status: AC
  Filled 2020-02-06: qty 100

## 2020-02-06 MED ORDER — LIDOCAINE HCL (CARDIAC) PF 100 MG/5ML IV SOSY
PREFILLED_SYRINGE | INTRAVENOUS | Status: DC | PRN
  Administered 2020-02-06: 80 mg via INTRAVENOUS

## 2020-02-06 MED ORDER — CIPROFLOXACIN HCL 250 MG PO TABS
250.0000 mg | ORAL_TABLET | Freq: Two times a day (BID) | ORAL | 0 refills | Status: AC
Start: 2020-02-06 — End: 2020-02-11

## 2020-02-06 MED ORDER — DEXAMETHASONE SODIUM PHOSPHATE 10 MG/ML IJ SOLN
INTRAMUSCULAR | Status: AC
  Filled 2020-02-06: qty 1

## 2020-02-06 MED ORDER — KETOROLAC TROMETHAMINE 30 MG/ML IJ SOLN
INTRAMUSCULAR | Status: DC | PRN
  Administered 2020-02-06: 15 mg via INTRAVENOUS

## 2020-02-06 MED ORDER — PROPOFOL 10 MG/ML IV BOLUS
INTRAVENOUS | Status: AC
  Filled 2020-02-06: qty 20

## 2020-02-06 MED ORDER — ROCURONIUM BROMIDE 10 MG/ML (PF) SYRINGE
PREFILLED_SYRINGE | INTRAVENOUS | Status: AC
  Filled 2020-02-06: qty 10

## 2020-02-06 MED ORDER — CEFAZOLIN SODIUM-DEXTROSE 2-4 GM/100ML-% IV SOLN
INTRAVENOUS | Status: AC
  Filled 2020-02-06: qty 100

## 2020-02-06 MED ORDER — GLYCOPYRROLATE 0.2 MG/ML IJ SOLN
INTRAMUSCULAR | Status: AC
  Filled 2020-02-06: qty 1

## 2020-02-06 MED ORDER — CHLORHEXIDINE GLUCONATE 0.12 % MT SOLN
OROMUCOSAL | Status: AC
  Administered 2020-02-06: 15 mL via OROMUCOSAL
  Filled 2020-02-06: qty 15

## 2020-02-06 MED ORDER — ORAL CARE MOUTH RINSE: 15.0000 mL | Freq: Once | OROMUCOSAL | Status: AC

## 2020-02-06 MED ORDER — LACTATED RINGERS IV SOLN: INTRAVENOUS | Status: DC

## 2020-02-06 SURGICAL SUPPLY — 32 items
BAG DRAIN CYSTO-URO LG1000N (MISCELLANEOUS) ×3 IMPLANT
BRUSH SCRUB EZ 1% IODOPHOR (MISCELLANEOUS) ×3 IMPLANT
CATH URETL 5X70 OPEN END (CATHETERS) ×3 IMPLANT
CNTNR SPEC 2.5X3XGRAD LEK (MISCELLANEOUS) ×1
CONT SPEC 4OZ STER OR WHT (MISCELLANEOUS) ×2
CONT SPEC 4OZ STRL OR WHT (MISCELLANEOUS) ×1
CONTAINER SPEC 2.5X3XGRAD LEK (MISCELLANEOUS) ×1 IMPLANT
DRAPE UTILITY 15X26 TOWEL STRL (DRAPES) ×3 IMPLANT
FIBER LASER TRAC TIP (UROLOGICAL SUPPLIES) IMPLANT
FIBER LASER TRACTIP 200 (UROLOGICAL SUPPLIES) ×3 IMPLANT
GLOVE BIOGEL PI IND STRL 7.5 (GLOVE) ×1 IMPLANT
GLOVE BIOGEL PI INDICATOR 7.5 (GLOVE) ×2
GOWN STRL REUS W/ TWL LRG LVL3 (GOWN DISPOSABLE) ×1 IMPLANT
GOWN STRL REUS W/ TWL XL LVL3 (GOWN DISPOSABLE) ×1 IMPLANT
GOWN STRL REUS W/TWL LRG LVL3 (GOWN DISPOSABLE) ×3
GOWN STRL REUS W/TWL XL LVL3 (GOWN DISPOSABLE) ×3
GUIDEWIRE STR DUAL SENSOR (WIRE) ×6 IMPLANT
INFUSOR MANOMETER BAG 3000ML (MISCELLANEOUS) ×3 IMPLANT
INTRODUCER DILATOR DOUBLE (INTRODUCER) IMPLANT
KIT TURNOVER CYSTO (KITS) ×3 IMPLANT
PACK CYSTO AR (MISCELLANEOUS) ×3 IMPLANT
SET CYSTO W/LG BORE CLAMP LF (SET/KITS/TRAYS/PACK) ×3 IMPLANT
SHEATH URETERAL 12FRX35CM (MISCELLANEOUS) IMPLANT
SOL .9 NS 3000ML IRR  AL (IV SOLUTION) ×2
SOL .9 NS 3000ML IRR AL (IV SOLUTION) ×1
SOL .9 NS 3000ML IRR UROMATIC (IV SOLUTION) ×1 IMPLANT
STENT URET 6FRX24 CONTOUR (STENTS) ×3 IMPLANT
STENT URET 6FRX26 CONTOUR (STENTS) IMPLANT
SURGILUBE 2OZ TUBE FLIPTOP (MISCELLANEOUS) ×3 IMPLANT
SYR 10ML LL (SYRINGE) ×3 IMPLANT
VALVE UROSEAL ADJ ENDO (VALVE) ×3 IMPLANT
WATER STERILE IRR 1000ML POUR (IV SOLUTION) ×3 IMPLANT

## 2020-02-06 NOTE — Transfer of Care (Signed)
Immediate Anesthesia Transfer of Care Note  Patient: Cassandra Carr  Procedure(s) Performed: CYSTOSCOPY/URETEROSCOPY/HOLMIUM LASER/STENT PLACEMENT (Right )  Patient Location: PACU  Anesthesia Type:General  Level of Consciousness: drowsy  Airway & Oxygen Therapy: Patient Spontanous Breathing and Patient connected to face mask oxygen  Post-op Assessment: Report given to RN and Post -op Vital signs reviewed and stable  Post vital signs: Reviewed and stable  Last Vitals:  Vitals Value Taken Time  BP 92/64 02/06/20 1033  Temp 36.3 C 02/06/20 1033  Pulse 102 02/06/20 1035  Resp 27 02/06/20 1035  SpO2 100 % 02/06/20 1035  Vitals shown include unvalidated device data.  Last Pain:  Vitals:   02/06/20 0913  TempSrc: Temporal  PainSc: 2          Complications: No complications documented.

## 2020-02-06 NOTE — Anesthesia Postprocedure Evaluation (Signed)
Anesthesia Post Note  Patient: Cassandra Carr  Procedure(s) Performed: CYSTOSCOPY/URETEROSCOPY/HOLMIUM LASER/STENT PLACEMENT (Right )  Patient location during evaluation: PACU Anesthesia Type: General Level of consciousness: awake and alert and oriented Pain management: pain level controlled Vital Signs Assessment: post-procedure vital signs reviewed and stable Respiratory status: spontaneous breathing, nonlabored ventilation and respiratory function stable Cardiovascular status: blood pressure returned to baseline and stable Postop Assessment: no signs of nausea or vomiting Anesthetic complications: no   No complications documented.   Last Vitals:  Vitals:   02/06/20 1126 02/06/20 1221  BP: 124/67 114/61  Pulse: 100 93  Resp: 16 16  Temp:    SpO2: 97% 99%    Last Pain:  Vitals:   02/06/20 1221  TempSrc:   PainSc: 0-No pain                 Ranyia Witting

## 2020-02-06 NOTE — Anesthesia Preprocedure Evaluation (Signed)
Anesthesia Evaluation  Patient identified by MRN, date of birth, ID band Patient awake    Reviewed: Allergy & Precautions, NPO status , Patient's Chart, lab work & pertinent test results  History of Anesthesia Complications Negative for: history of anesthetic complications  Airway Mallampati: II  TM Distance: >3 FB Neck ROM: Full    Dental no notable dental hx.    Pulmonary asthma (daily controller) , neg sleep apnea,    breath sounds clear to auscultation- rhonchi (-) wheezing      Cardiovascular Exercise Tolerance: Good (-) hypertension(-) CAD, (-) Past MI, (-) Cardiac Stents and (-) CABG  Rhythm:Regular Rate:Normal - Systolic murmurs and - Diastolic murmurs    Neuro/Psych  Headaches, Anxiety    GI/Hepatic negative GI ROS, Neg liver ROS,   Endo/Other  negative endocrine ROSneg diabetes  Renal/GU negative Renal ROS     Musculoskeletal  (+) Arthritis ,   Abdominal (+) - obese,   Peds  Hematology  (+) anemia ,   Anesthesia Other Findings Past Medical History: No date: Anemia No date: Anxiety No date: Asthma No date: Headache No date: History of kidney stones No date: Rheumatoid arthritis ([place])   Reproductive/Obstetrics                             Anesthesia Physical Anesthesia Plan  ASA: II  Anesthesia Plan: General   Post-op Pain Management:    Induction: Intravenous  PONV Risk Score and Plan: 2 and Ondansetron, Dexamethasone and Midazolam  Airway Management Planned: Oral ETT  Additional Equipment:   Intra-op Plan:   Post-operative Plan: Extubation in OR  Informed Consent: I have reviewed the patients History and Physical, chart, labs and discussed the procedure including the risks, benefits and alternatives for the proposed anesthesia with the patient or authorized representative who has indicated his/her understanding and acceptance.     Dental advisory  given  Plan Discussed with: CRNA and Anesthesiologist  Anesthesia Plan Comments:         Anesthesia Quick Evaluation

## 2020-02-06 NOTE — Op Note (Signed)
Date of procedure: 02/06/20  Preoperative diagnosis:  1. Right distal ureteral stone 2. Right renal stone  Postoperative diagnosis:  1. Same  Procedure: 1. Cystoscopy, right retrograde pyelogram with intraoperative interpretation, right ureteral stent placement 2. Right ureteroscopy, laser lithotripsy of distal ureteral stone 3. Right ureteroscopy, laser lithotripsy of renal stone  Surgeon: Nickolas Madrid, MD  Anesthesia: General  Complications: None  Intraoperative findings:  1.  Uncomplicated lithotripsy of right distal ureteral stone and right renal stone with stent placement  EBL: Minimal  Specimens: None  Drains: Right 6 French by 24 cm ureteral stent  Indication: Cassandra Carr is a 55 y.o. patient with a 9 mm mid ureteral stone and poorly controlled pain who opted for ureteroscopy.  After reviewing the management options for treatment, they elected to proceed with the above surgical procedure(s). We have discussed the potential benefits and risks of the procedure, side effects of the proposed treatment, the likelihood of the patient achieving the goals of the procedure, and any potential problems that might occur during the procedure or recuperation. Informed consent has been obtained.  Description of procedure:  The patient was taken to the operating room and general anesthesia was induced. SCDs were placed for DVT prophylaxis. The patient was placed in the dorsal lithotomy position, prepped and draped in the usual sterile fashion, and preoperative antibiotics(Ancef) were administered. A preoperative time-out was performed.   A 21 French rigid cystoscope was used to intubate the urethra and thorough cystoscopy was performed.  The bladder was grossly normal.  A sensor wire was advanced into the right ureteral orifice and met resistance within a few centimeters, under fluoroscopy I was able to pass the wire alongside the stone that had migrated distally and up into the  kidney under fluoroscopic vision.  A semirigid ureteroscope was then advanced alongside the wire and I identified a 9 mm black and yellow calcium oxalate appearing stone in the distal ureter.  The 200 m laser fiber on settings of 1.0 J and 10 Hz was used to fragment the stone to dust, and the pieces irrigated free from the ureteral orifice.   The single-channel flexible digital ureteroscope then advanced easily over the wire up into the kidney under fluoroscopic vision.  Thorough pyeloscopy revealed a 4 mm stone in the upper pole, and this was fragmented to dust on the after mentioned settings.  Thorough pyeloscopy revealed no other stones.  A retrograde pyelogram showed no extravasation or filling defect.  The wire was replaced through the scope, and pullback ureteroscopy did not reveal any residual fragments or ureteral injury.  A 6 French by 24 cm ureteral stent was fluoroscopically placed with an excellent curl in the renal pelvis, as well as in the bladder.  Cystoscopy was performed showing an excellent curl in the bladder, and the bladder was drained.  A belladonna suppository was placed.  Disposition: Stable to PACU  Plan: Home today, follow-up on Thursday for stent removal in clinic  Nickolas Madrid, MD

## 2020-02-06 NOTE — Telephone Encounter (Signed)
I just spoke with Cassandra Carr via telephone to explain that I would like to start Cassandra Carr on Cipro 250 mg twice daily x5 days per the results of her preoperative urine culture.  Prescription sent to [facility]; encouraged him to get this filled and start it today.  I counseled them to proceed to the emergency room if she develops a fever greater than 101.3 F.  He expressed understanding.

## 2020-02-06 NOTE — H&P (Signed)
   02/06/20 9:14 AM   Denzil Hughes 11-15-1965 [id number]  CC: right ureteral stone  HPI: Ms. Cieslik is a healthy 54 year old female who presented to the ER last week with uncontrolled right-sided flank pain secondary to a 9 mm right proximal ureteral stone.  She opted for ureteroscopy, laser lithotripsy, and stent placement.  She continued to have pain over the last 48 hours in the right groin.  She denies any fevers or chills.   PMH: Past Medical History:  Diagnosis Date  . Anemia   . Anxiety   . Asthma   . Headache   . History of kidney stones   . Rheumatoid arthritis [facility])     Surgical History: Past Surgical History:  Procedure Laterality Date  . ABDOMINAL HYSTERECTOMY    . BREAST BIOPSY Left    negative 06/2012  . CHOLECYSTECTOMY       Family History: Family History  Problem Relation Age of Onset  . Breast cancer Neg Hx     Social History:  reports that she has never smoked. She has never used smokeless tobacco. She reports that she does not drink alcohol and does not use drugs.  Physical Exam:  Constitutional:  Alert and oriented, No acute distress. Cardiovascular: RRR Respiratory: CTA bilaterally GI: Abdomen is soft, nontender, nondistended, no abdominal masses   Laboratory Data: Urinalysis 7/29 11-20 RBCs, 6-10 WBCs, rare bacteria, nitrite negative, no leukocytes.  Urine culture with no growth  Pertinent Imaging: I have personally reviewed the CT showing a right 9 mm proximal ureteral stone with upstream hydronephrosis as well as a 4 mm upper pole stone.  Assessment & Plan:   We specifically discussed the risks ureteroscopy including bleeding, infection/sepsis, stent related symptoms including flank pain/urgency/frequency/incontinence/dysuria, ureteral injury, inability to access stone, or need for staged or additional procedures.  RIGHT ureteroscopy, laser lithotripsy, stent placement  Nickolas Madrid, MD 02/06/2020  [facility] [address], [place], [place] [address] [phone]

## 2020-02-06 NOTE — Discharge Instructions (Signed)

## 2020-02-06 NOTE — Anesthesia Procedure Notes (Signed)
Procedure Name: Intubation Performed by: Remell Giaimo, CRNA Pre-anesthesia Checklist: Patient identified, Patient being monitored, Timeout performed, Emergency Drugs available and Suction available Patient Re-evaluated:Patient Re-evaluated prior to induction Oxygen Delivery Method: Circle system utilized Preoxygenation: Pre-oxygenation with 100% oxygen Induction Type: IV induction Ventilation: Mask ventilation without difficulty Laryngoscope Size: 3 and McGraph Grade View: Grade I Tube type: Oral Tube size: 7.0 mm Number of attempts: 1 Airway Equipment and Method: Stylet and Video-laryngoscopy Placement Confirmation: ETT inserted through vocal cords under direct vision,  positive ETCO2 and breath sounds checked- equal and bilateral Secured at: 21 cm Tube secured with: Tape Dental Injury: Teeth and Oropharynx as per pre-operative assessment        

## 2020-02-07 ENCOUNTER — Encounter: Payer: Self-pay | Admitting: Urology

## 2020-02-08 DIAGNOSIS — M48061 Spinal stenosis, lumbar region without neurogenic claudication: Secondary | ICD-10-CM | POA: Insufficient documentation

## 2020-02-08 DIAGNOSIS — M5116 Intervertebral disc disorders with radiculopathy, lumbar region: Secondary | ICD-10-CM | POA: Insufficient documentation

## 2020-02-08 DIAGNOSIS — M5441 Lumbago with sciatica, right side: Secondary | ICD-10-CM | POA: Insufficient documentation

## 2020-02-09 ENCOUNTER — Encounter: Payer: Self-pay | Admitting: Urology

## 2020-02-09 ENCOUNTER — Ambulatory Visit (INDEPENDENT_AMBULATORY_CARE_PROVIDER_SITE_OTHER): Payer: BC Managed Care – PPO | Admitting: Urology

## 2020-02-09 ENCOUNTER — Other Ambulatory Visit: Payer: Self-pay

## 2020-02-09 VITALS — BP 122/71 | HR 76 | Ht 61.0 in | Wt 132.8 lb

## 2020-02-09 DIAGNOSIS — N201 Calculus of ureter: Secondary | ICD-10-CM

## 2020-02-09 MED ORDER — LIDOCAINE HCL URETHRAL/MUCOSAL 2 % EX GEL
1.0000 "application " | Freq: Once | CUTANEOUS | Status: AC
  Administered 2020-02-09: 1 via URETHRAL

## 2020-02-09 NOTE — Progress Notes (Signed)
Cystoscopy Procedure Note:  Indication: Stent removal s/p 8/2 right URS/LL for 1cm distal stone and 34mm upper pole stone  After informed consent and discussion of the procedure and its risks, Cassandra Carr was positioned and prepped in the standard fashion. Cystoscopy was performed with a flexible cystoscope. The stent was grasped with flexible graspers and removed in its entirety. The patient tolerated the procedure well.  Findings: Uncomplicated stent removal  Assessment and Plan: Follow up for virtual visit in 4 weeks with renal ultrasound to evaluate for silent hydronephrosis  Billey Co, MD 02/09/2020

## 2020-02-09 NOTE — Patient Instructions (Signed)
Dietary Guidelines to Help Prevent Kidney Stones Kidney stones are deposits of minerals and salts that form inside your kidneys. Your risk of developing kidney stones may be greater depending on your diet, your lifestyle, the medicines you take, and whether you have certain medical conditions. Most people can reduce their chances of developing kidney stones by following the instructions below. Depending on your overall health and the type of kidney stones you tend to develop, your dietitian may give you more specific instructions. What are tips for following this plan? Reading food labels  Choose foods with "no salt added" or "low-salt" labels. Limit your sodium intake to less than 1500 mg per day.  Choose foods with calcium for each meal and snack. Try to eat about 300 mg of calcium at each meal. Foods that contain 200-500 mg of calcium per serving include: ? 8 oz (237 ml) of milk, fortified nondairy milk, and fortified fruit juice. ? 8 oz (237 ml) of kefir, yogurt, and soy yogurt. ? 4 oz (118 ml) of tofu. ? 1 oz of cheese. ? 1 cup (300 g) of dried figs. ? 1 cup (91 g) of cooked broccoli. ? 1-3 oz can of sardines or mackerel.  Most people need 1000 to 1500 mg of calcium each day. Talk to your dietitian about how much calcium is recommended for you. Shopping  Buy plenty of fresh fruits and vegetables. Most people do not need to avoid fruits and vegetables, even if they contain nutrients that may contribute to kidney stones.  When shopping for convenience foods, choose: ? Whole pieces of fruit. ? Premade salads with dressing on the side. ? Low-fat fruit and yogurt smoothies.  Avoid buying frozen meals or prepared deli foods.  Look for foods with live cultures, such as yogurt and kefir. Cooking  Do not add salt to food when cooking. Place a salt shaker on the table and allow each person to add his or her own salt to taste.  Use vegetable protein, such as beans, textured vegetable  protein (TVP), or tofu instead of meat in pasta, casseroles, and soups. Meal planning   Eat less salt, if told by your dietitian. To do this: ? Avoid eating processed or premade food. ? Avoid eating fast food.  Eat less animal protein, including cheese, meat, poultry, or fish, if told by your dietitian. To do this: ? Limit the number of times you have meat, poultry, fish, or cheese each week. Eat a diet free of meat at least 2 days a week. ? Eat only one serving each day of meat, poultry, fish, or seafood. ? When you prepare animal protein, cut pieces into small portion sizes. For most meat and fish, one serving is about the size of one deck of cards.  Eat at least 5 servings of fresh fruits and vegetables each day. To do this: ? Keep fruits and vegetables on hand for snacks. ? Eat 1 piece of fruit or a handful of berries with breakfast. ? Have a salad and fruit at lunch. ? Have two kinds of vegetables at dinner.  Limit foods that are high in a substance called oxalate. These include: ? Spinach. ? Rhubarb. ? Beets. ? Potato chips and french fries. ? Nuts.  If you regularly take a diuretic medicine, make sure to eat at least 1-2 fruits or vegetables high in potassium each day. These include: ? Avocado. ? Banana. ? Orange, prune, carrot, or tomato juice. ? Baked potato. ? Cabbage. ? Beans and split   peas. General instructions   Drink enough fluid to keep your urine clear or pale yellow. This is the most important thing you can do.  Talk to your health care provider and dietitian about taking daily supplements. Depending on your health and the cause of your kidney stones, you may be advised: ? Not to take supplements with vitamin C. ? To take a calcium supplement. ? To take a daily probiotic supplement. ? To take other supplements such as magnesium, fish oil, or vitamin B6.  Take all medicines and supplements as told by your health care provider.  Limit alcohol intake to no  more than 1 drink a day for nonpregnant women and 2 drinks a day for men. One drink equals 12 oz of beer, 5 oz of wine, or 1 oz of hard liquor.  Lose weight if told by your health care provider. Work with your dietitian to find strategies and an eating plan that works best for you. What foods are not recommended? Limit your intake of the following foods, or as told by your dietitian. Talk to your dietitian about specific foods you should avoid based on the type of kidney stones and your overall health. Grains Breads. Bagels. Rolls. Baked goods. Salted crackers. Cereal. Pasta. Vegetables Spinach. Rhubarb. Beets. Canned vegetables. Pickles. Olives. Meats and other protein foods Nuts. Nut butters. Large portions of meat, poultry, or fish. Salted or cured meats. Deli meats. Hot dogs. Sausages. Dairy Cheese. Beverages Regular soft drinks. Regular vegetable juice. Seasonings and other foods Seasoning blends with salt. Salad dressings. Canned soups. Soy sauce. Ketchup. Barbecue sauce. Canned pasta sauce. Casseroles. Pizza. Lasagna. Frozen meals. Potato chips. French fries. Summary  You can reduce your risk of kidney stones by making changes to your diet.  The most important thing you can do is drink enough fluid. You should drink enough fluid to keep your urine clear or pale yellow.  Ask your health care provider or dietitian how much protein from animal sources you should eat each day, and also how much salt and calcium you should have each day. This information is not intended to replace advice given to you by your health care provider. Make sure you discuss any questions you have with your health care provider. Document Revised: 10/13/2018 Document Reviewed: 06/03/2016 Elsevier Patient Education  2020 Elsevier Inc.  

## 2020-02-10 LAB — MICROSCOPIC EXAMINATION
Bacteria, UA: NONE SEEN
RBC, Urine: >30 /hpf — AB (ref 0–2)

## 2020-02-10 LAB — URINALYSIS, COMPLETE
Specific Gravity, UA: 1.02 (ref 1.005–1.030)
pH, UA: 8.5 — ABNORMAL HIGH (ref 5.0–7.5)

## 2020-02-16 ENCOUNTER — Encounter: Payer: Self-pay | Admitting: Urology

## 2020-02-16 ENCOUNTER — Other Ambulatory Visit: Payer: Self-pay | Admitting: *Deleted

## 2020-02-16 DIAGNOSIS — N201 Calculus of ureter: Secondary | ICD-10-CM

## 2020-02-27 ENCOUNTER — Other Ambulatory Visit: Payer: Self-pay

## 2020-02-27 ENCOUNTER — Ambulatory Visit
Admission: RE | Admit: 2020-02-27 | Discharge: 2020-02-27 | Disposition: A | Discharge status: Home / Self Care | Payer: BC Managed Care – PPO | Source: Ambulatory Visit | Attending: Urology | Admitting: Urology

## 2020-02-27 DIAGNOSIS — N201 Calculus of ureter: Secondary | ICD-10-CM | POA: Insufficient documentation

## 2020-02-28 ENCOUNTER — Telehealth: Payer: Self-pay

## 2020-02-28 NOTE — Telephone Encounter (Signed)
-----   Message from Billey Co, MD sent at 02/28/2020  2:26 PM EDT ----- Kidney US was normal with no blockage or residual stones, good news, follow up as scheduled  Nickolas Madrid, MD 02/28/2020

## 2020-02-28 NOTE — Telephone Encounter (Signed)
Called pt, no answer. Unable to leave message as no DPR is on file. Left generic message for pt to call back. 1st attempt.

## 2020-02-29 NOTE — Telephone Encounter (Signed)
Called pt no answer. Unable to leave message as no DPR is on file. Second attempt. Will send mychart message.

## 2020-03-05 ENCOUNTER — Other Ambulatory Visit: Payer: Self-pay

## 2020-03-05 ENCOUNTER — Encounter: Payer: Self-pay | Admitting: Physician Assistant

## 2020-03-05 ENCOUNTER — Ambulatory Visit: Payer: Self-pay | Admitting: Physician Assistant

## 2020-03-05 ENCOUNTER — Ambulatory Visit (INDEPENDENT_AMBULATORY_CARE_PROVIDER_SITE_OTHER): Payer: BC Managed Care – PPO | Admitting: Physician Assistant

## 2020-03-05 VITALS — BP 120/72 | HR 78 | Ht 61.0 in | Wt 130.0 lb

## 2020-03-05 DIAGNOSIS — N200 Calculus of kidney: Secondary | ICD-10-CM

## 2020-03-05 NOTE — Progress Notes (Signed)
03/05/2020 1:38 PM   Cassandra Carr 10-01-1965 [id number]  CC: Chief Complaint  Patient presents with  . Other    HPI: Cassandra Carr is a 54 y.o. female s/p URS/LL/stent placement for management of a 9 mm distal right ureteral stone as well as a right renal stone on 02/06/2020 with Dr. Diamantina Providence.  Cystoscopy stent removal completed in clinic on POD 3.  Follow-up renal ultrasound with no residual stone or obstruction.  No stone analysis sent.  Today she reports resolution of flank pain since surgery.  She has no acute concerns.  She reports consuming a high oxalate diet with her favorite foods including sweet tea, dark leafy greens, and beans.  She continues to take Fosamax for osteoporosis.  In office UA today notable for trace-lysed blood and trace leukocyte esterase.  UA microscopy pan negative.  PMH: Past Medical History:  Diagnosis Date  . Anemia   . Anxiety   . Asthma   . Headache   . History of kidney stones   . Kidney stone   . Rheumatoid arthritis [facility])     Surgical History: Past Surgical History:  Procedure Laterality Date  . ABDOMINAL HYSTERECTOMY    . BREAST BIOPSY Left    negative 06/2012  . CHOLECYSTECTOMY    . CYSTOSCOPY/URETEROSCOPY/HOLMIUM LASER/STENT PLACEMENT Right 02/06/2020   Procedure: CYSTOSCOPY/URETEROSCOPY/HOLMIUM LASER/STENT PLACEMENT;  Surgeon: Billey Co, MD;  Location: [facility] ORS;  Service: Urology;  Laterality: Right;    Home Medications:  Allergies as of 03/05/2020   No Known Allergies     Medication List       Accurate as of March 05, 2020 11:59 PM. If you have any questions, ask your nurse or doctor.        alendronate 70 MG tablet Commonly known as: FOSAMAX Take 70 mg by mouth once a week.   ALPRAZolam 0.25 MG tablet Commonly known as: XANAX Take 0.25 mg by mouth daily as needed.   celecoxib 200 MG capsule Commonly known as: CELEBREX Take 200 mg by mouth daily.   cloNIDine 0.3 MG tablet Commonly known as:  CATAPRES Take 0.3 mg by mouth daily. In evening   Cosentyx Sensoready (300 MG) 150 MG/ML Soaj Generic drug: Secukinumab (300 MG Dose) Inject 2 Syringes into the skin every 28 (twenty-eight) days.   fluticasone 110 MCG/ACT inhaler Commonly known as: FLOVENT HFA Inhale 2 puffs into the lungs daily.   folic acid 1 MG tablet Commonly known as: FOLVITE Take 1 mg by mouth daily.   gabapentin 100 MG capsule Commonly known as: NEURONTIN Take 100 mg by mouth daily.   methotrexate 2.5 MG tablet SMARTSIG:5 Tablet(s) By Mouth Once a Week   ondansetron 4 MG disintegrating tablet Commonly known as: Zofran ODT Take 1 tablet (4 mg total) by mouth every 8 (eight) hours as needed for nausea or vomiting.   oxyCODONE-acetaminophen 5-325 MG tablet Commonly known as: Percocet Take 1 tablet by mouth every 4 (four) hours as needed for severe pain.   sertraline 100 MG tablet Commonly known as: ZOLOFT Take 100 mg by mouth daily. AM   SUMAtriptan 50 MG tablet Commonly known as: IMITREX Take 50 mg by mouth as needed for migraine (take one may repeat in 6 hours if needed).   tamsulosin 0.4 MG Caps capsule Commonly known as: FLOMAX Take 1 capsule (0.4 mg total) by mouth daily.   tiZANidine 4 MG tablet Commonly known as: ZANAFLEX Take 4 mg by mouth daily. AM   Vitamin D (  Ergocalciferol) 1.25 MG (50000 UNIT) Caps capsule Commonly known as: DRISDOL Take 50,000 Units by mouth once a week.       Allergies:  No Known Allergies  Family History: Family History  Problem Relation Age of Onset  . Breast cancer Neg Hx     Social History:   reports that she has never smoked. She has never used smokeless tobacco. She reports that she does not drink alcohol and does not use drugs.  Physical Exam: BP 120/72   Pulse 78   Ht 5\' 1"  (1.549 m)   Wt 130 lb (59 kg)   BMI 24.56 kg/m   Constitutional:  Alert and oriented, no acute distress, nontoxic appearing HEENT: [place], AT Cardiovascular: No  clubbing, cyanosis, or edema Respiratory: Normal respiratory effort, no increased work of breathing Skin: No rashes, bruises or suspicious lesions Neurologic: Grossly intact, no focal deficits, moving all 4 extremities Psychiatric: Normal mood and affect  Laboratory Data: Results for orders placed or performed in visit on 03/05/20  Microscopic Examination   Urine  Result Value Ref Range   WBC, UA 0-5 0 - 5 /hpf   RBC 0-2 0 - 2 /hpf   Epithelial Cells (non renal) 0-10 0 - 10 /hpf   Bacteria, UA None seen None seen/Few  Urinalysis, Complete  Result Value Ref Range   Specific Gravity, UA 1.020 1.005 - 1.030   pH, UA 6.0 5.0 - 7.5   Color, UA Yellow Yellow   Appearance Ur Clear Clear   Leukocytes,UA Trace (A) Negative   Protein,UA Negative Negative/Trace   Glucose, UA Negative Negative   Ketones, UA Negative Negative   RBC, UA Trace (A) Negative   Bilirubin, UA Negative Negative   Urobilinogen, Ur 0.2 0.2 - 1.0 mg/dL   Nitrite, UA Negative Negative   Microscopic Examination See below:    Assessment & Plan:   1. Nephrolithiasis UA with resolution of MH consistent with resolved stone episode.  No further intervention indicated.  We discussed general stone prevention recommendations today including increasing fluid intake, decreasing oxalate intake, maintaining moderate calcium intake, decreasing sodium intake, and increasing citric acid intake.  Written guidance also provided today.  I offered her a metabolic work-up for further evaluation of her nephrolithiasis, however explained her stones are likely secondary to Fosamax use and a high oxalate diet.  She would like to defer metabolic work-up at this time.  I am in agreement with this plan.  Patient prefers to follow-up on an annual basis with [facility] prior to monitor stone formation. - Urinalysis, Complete   Return in about 1 year (around 03/05/2021) for Stone f/u with [facility] prior.  Debroah Loop, PA-C  [facility]  [facility] [address], [place], [place] [address] [phone]

## 2020-03-06 LAB — URINALYSIS, COMPLETE
Bilirubin, UA: NEGATIVE
Glucose, UA: NEGATIVE
Ketones, UA: NEGATIVE
Nitrite, UA: NEGATIVE
Protein,UA: NEGATIVE
Specific Gravity, UA: 1.02 (ref 1.005–1.030)
Urobilinogen, Ur: 0.2 mg/dL (ref 0.2–1.0)
pH, UA: 6 (ref 5.0–7.5)

## 2020-03-06 LAB — MICROSCOPIC EXAMINATION: Bacteria, UA: NONE SEEN

## 2020-03-29 ENCOUNTER — Telehealth: Payer: Self-pay | Admitting: Urology

## 2020-09-28 ENCOUNTER — Other Ambulatory Visit: Payer: Self-pay | Admitting: Internal Medicine

## 2020-09-28 DIAGNOSIS — Z1231 Encounter for screening mammogram for malignant neoplasm of breast: Secondary | ICD-10-CM

## 2020-10-30 ENCOUNTER — Ambulatory Visit
Admission: RE | Admit: 2020-10-30 | Discharge: 2020-10-30 | Disposition: A | Discharge status: Home / Self Care | Payer: BC Managed Care – PPO | Source: Ambulatory Visit | Attending: Internal Medicine | Admitting: Internal Medicine

## 2020-10-30 ENCOUNTER — Other Ambulatory Visit: Payer: Self-pay

## 2020-10-30 DIAGNOSIS — Z1231 Encounter for screening mammogram for malignant neoplasm of breast: Secondary | ICD-10-CM | POA: Diagnosis not present

## 2021-03-04 ENCOUNTER — Other Ambulatory Visit: Payer: Self-pay

## 2021-03-04 DIAGNOSIS — N200 Calculus of kidney: Secondary | ICD-10-CM

## 2021-03-05 ENCOUNTER — Ambulatory Visit: Payer: Self-pay | Admitting: Physician Assistant

## 2021-03-08 ENCOUNTER — Encounter: Payer: Self-pay | Admitting: Physician Assistant

## 2021-10-10 IMAGING — CR DG ABDOMEN 1V
1 series · 2 of 2 positions shown · non-contrast
Comparison: Correlation made with CT from 02/02/2020

CLINICAL DATA: Right kidney stone, follow-up

EXAM:
ABDOMEN - 1 VIEW

[Series 1: dg abd 1 view · 0.14mm/px · 2 of 2 slices shown]
[im 1/2]
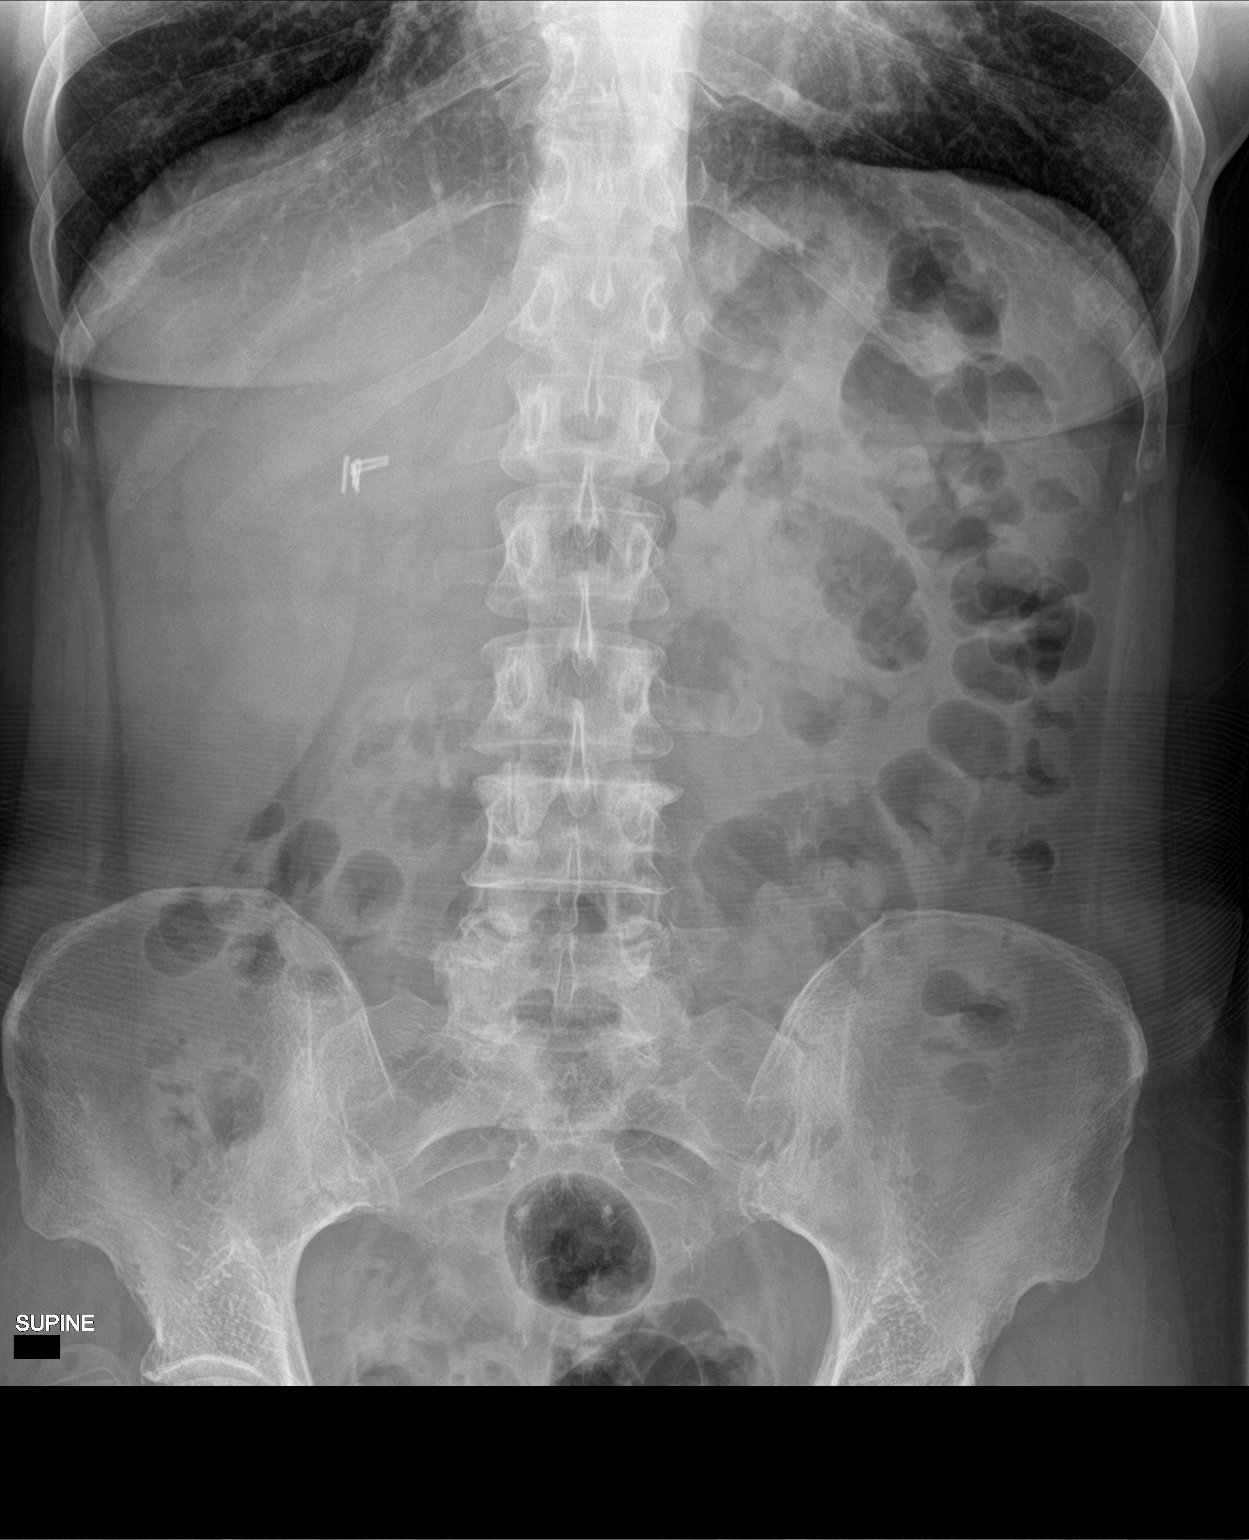
[im 2/2]
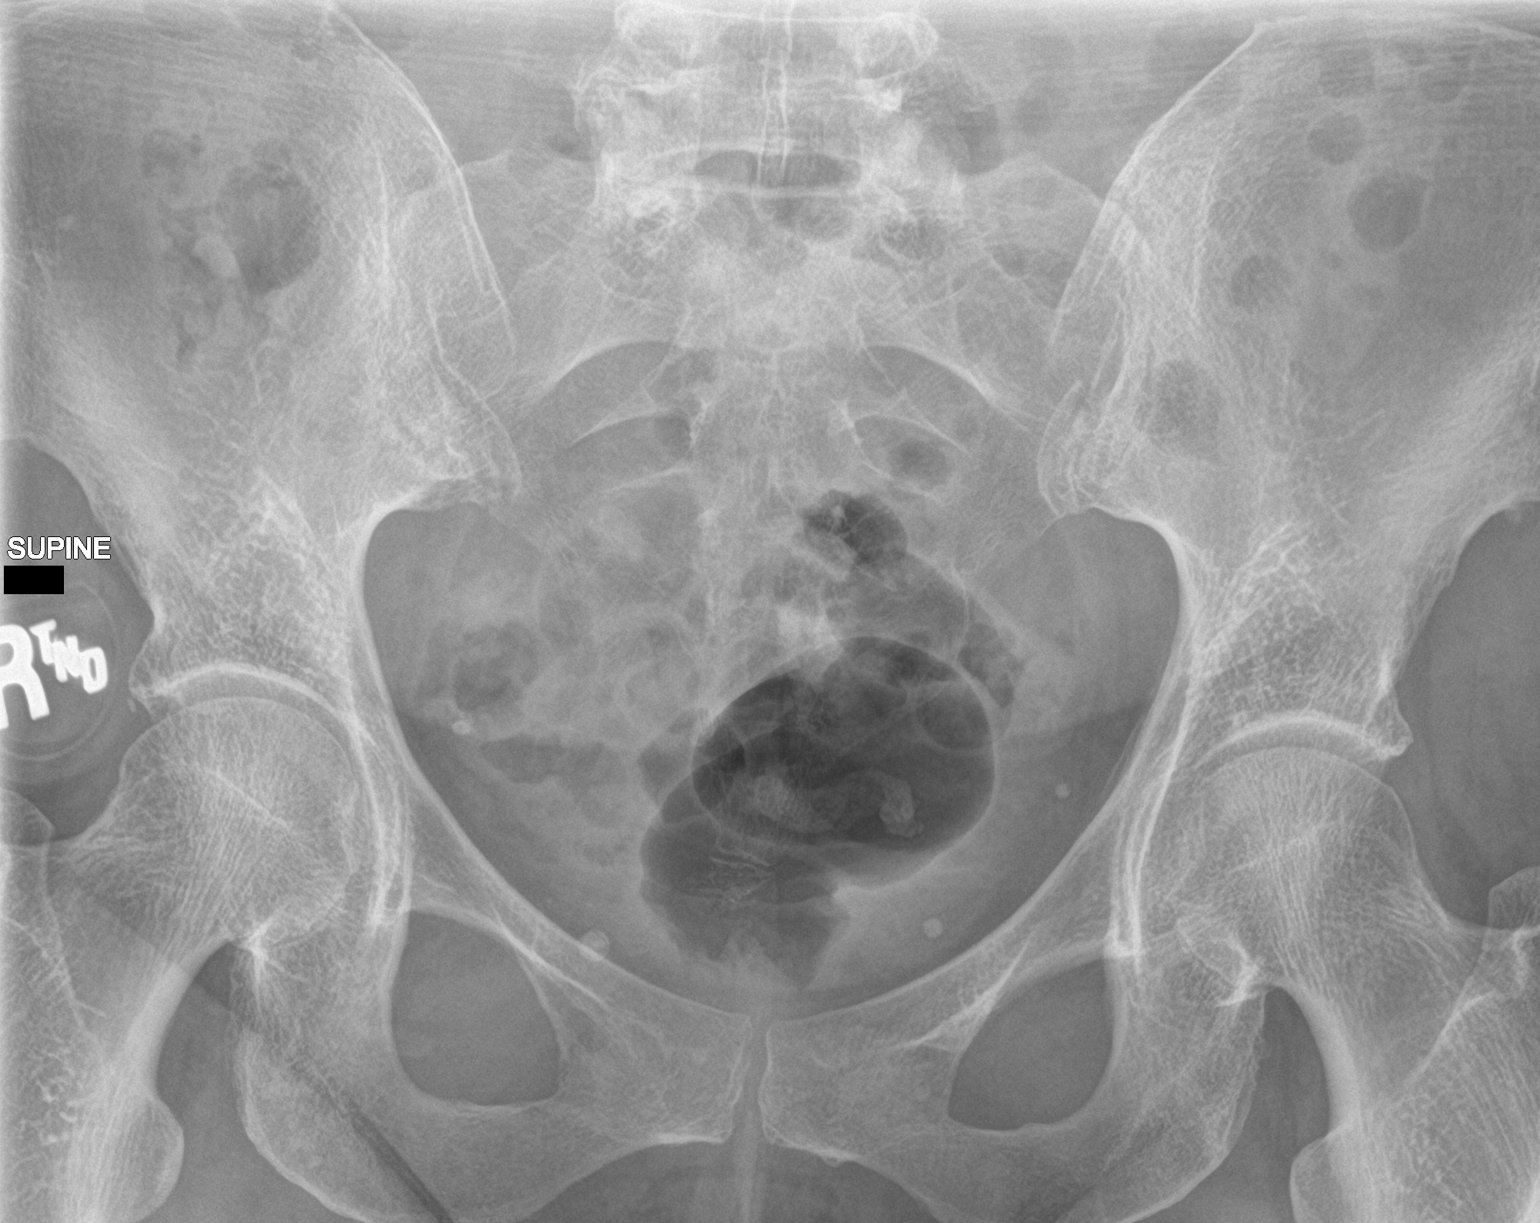

[2 of 2 positions shown; findings below may reference images not displayed]

FINDINGS: Right proximal ureteral calculus on the prior study near the level
of the L3 transverse process is not definitely identified. Possible
increased density inferior to the transverse process could represent
the stone. Punctate calculus of the right upper pole is present.
IMPRESSION: Right ureteral calculus is not definitely identified but may have
moved slightly distally.

Punctate right upper pole renal calculus.

## 2021-10-14 ENCOUNTER — Other Ambulatory Visit: Payer: Self-pay | Admitting: Internal Medicine

## 2021-10-14 DIAGNOSIS — Z1231 Encounter for screening mammogram for malignant neoplasm of breast: Secondary | ICD-10-CM

## 2021-11-03 IMAGING — US US RENAL
1 series · 14 of 25 positions shown · non-contrast
Comparison: CT abdomen and pelvis 02/02/2020

CLINICAL DATA: RIGHT ureteral calculus, some a hydronephrosis

EXAM:
RENAL / URINARY TRACT ULTRASOUND COMPLETE

[Series 1: us renal · 14 of 38 slices shown]
[im 1/38]
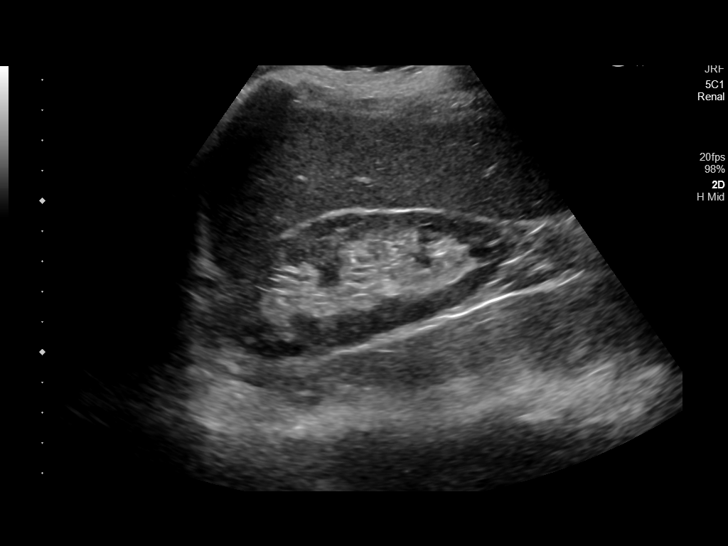
[im 4/38]
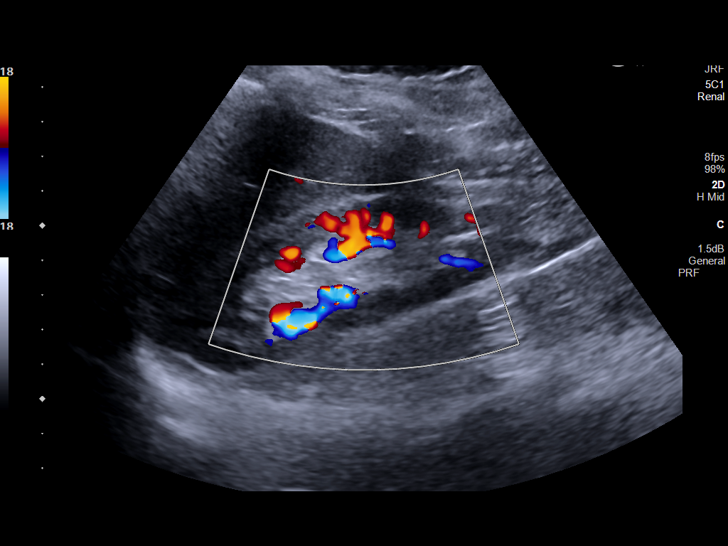
[im 7/38]
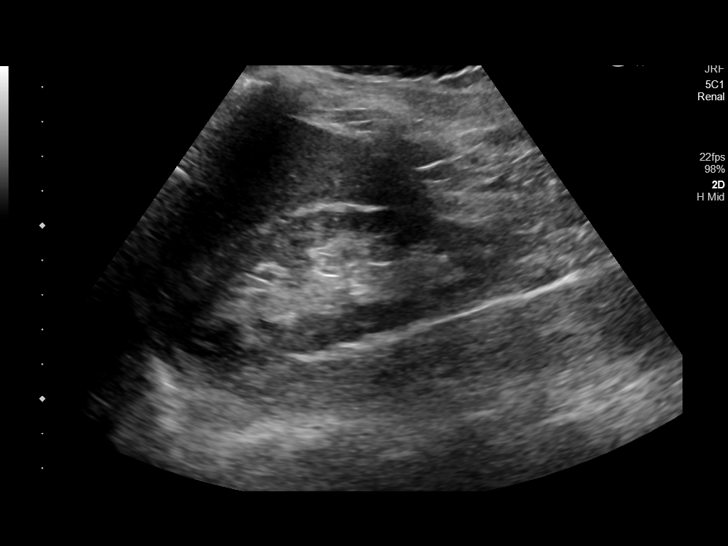
[im 10/38]
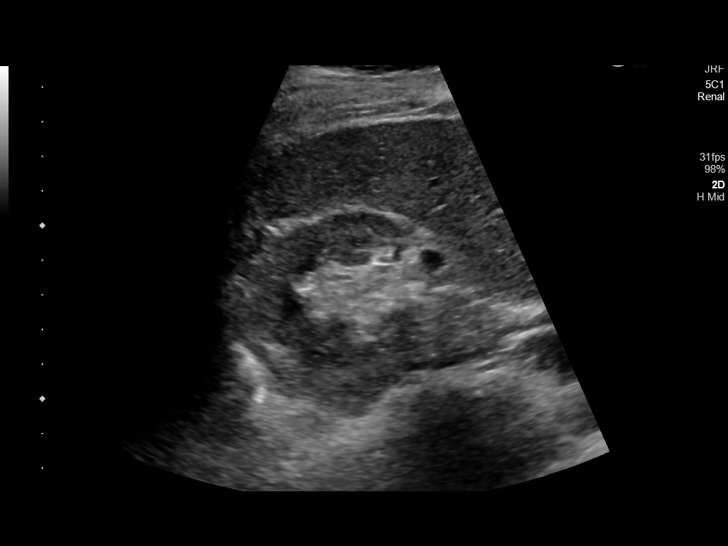
[im 13/38]
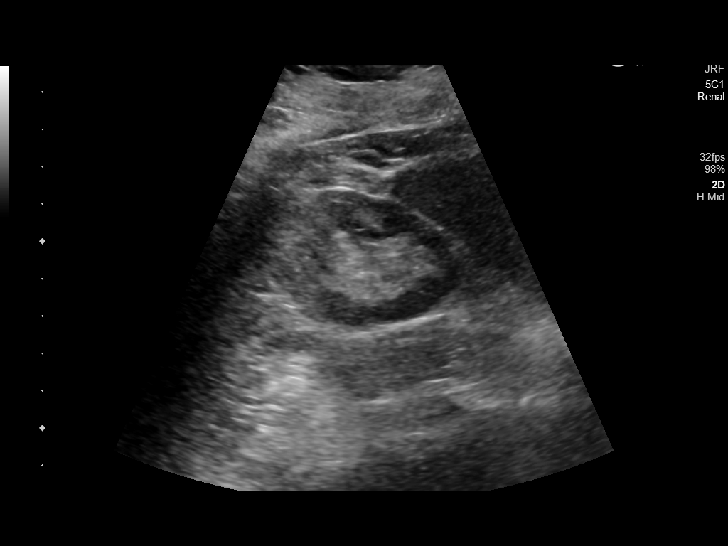
[im 14/38]
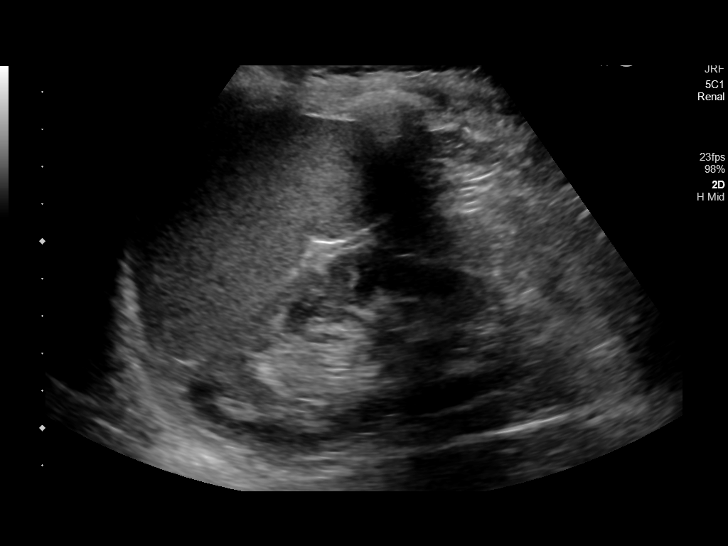
[im 17/38]
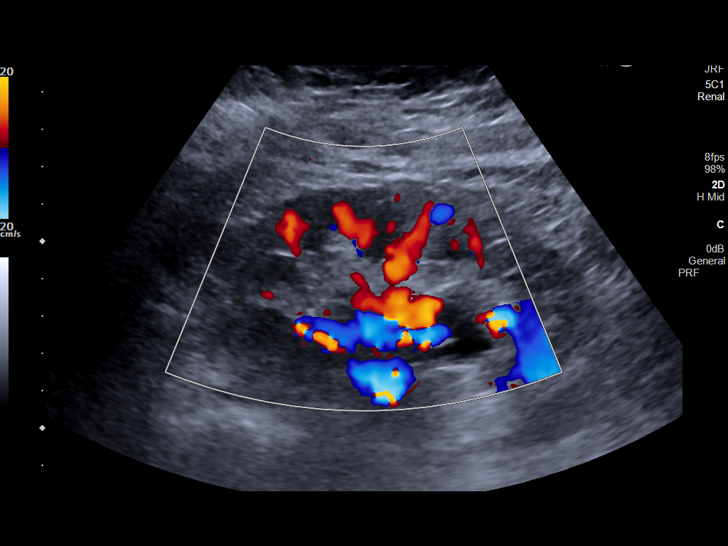
[im 21/38]
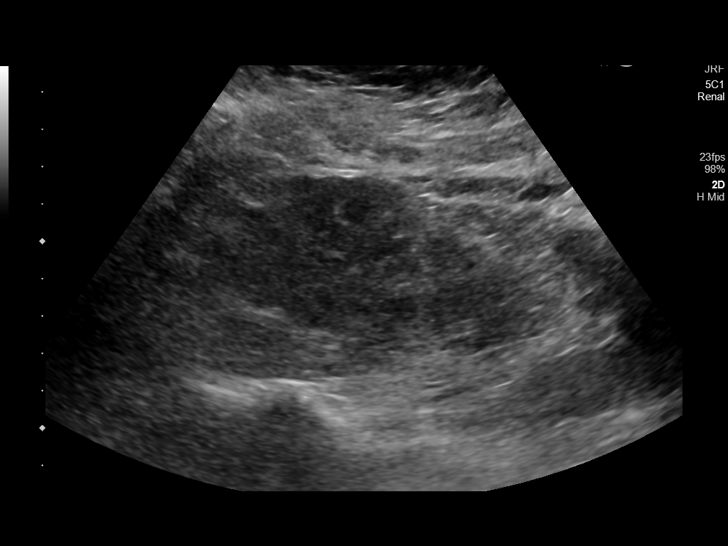
[im 24/38]
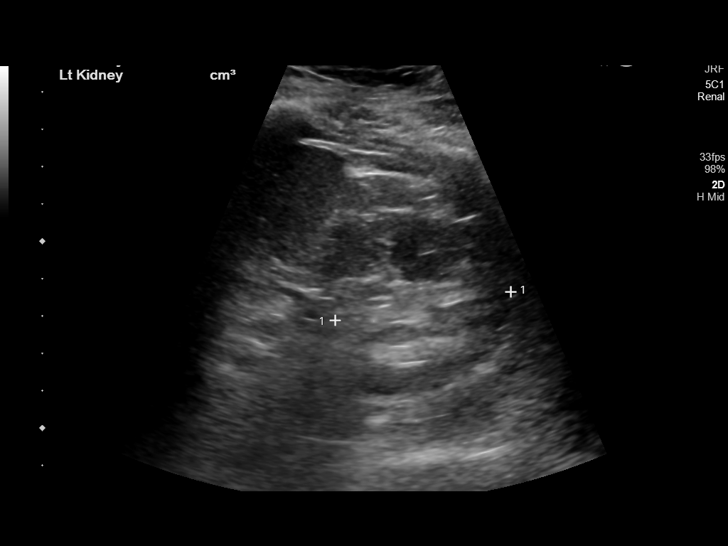
[im 25/38]
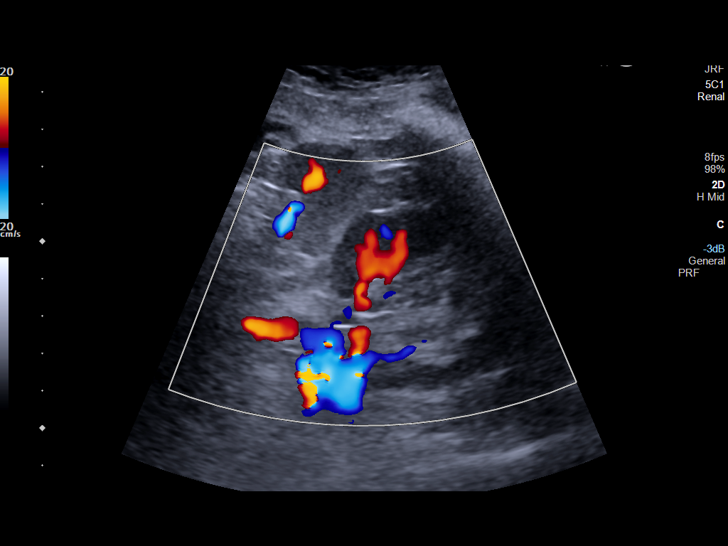
[im 28/38]
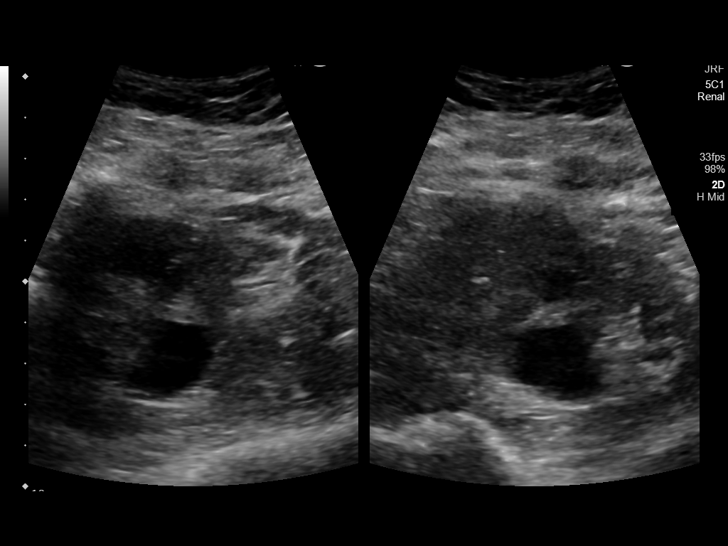
[im 31/38]
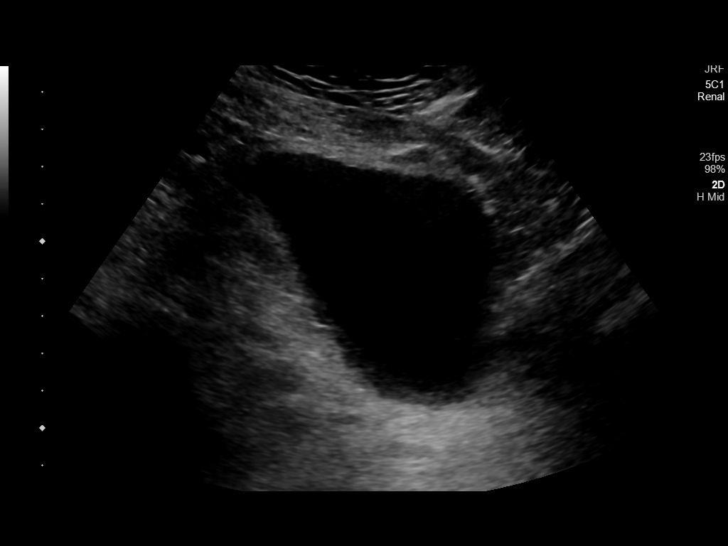
[im 34/38]
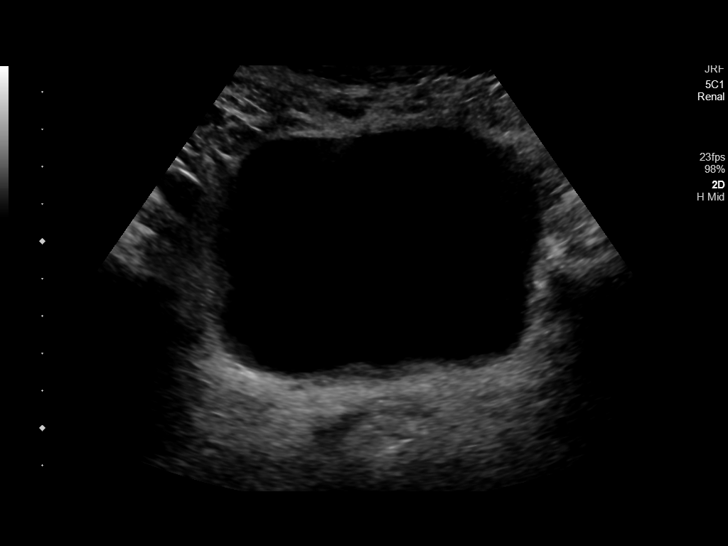
[im 38/38]
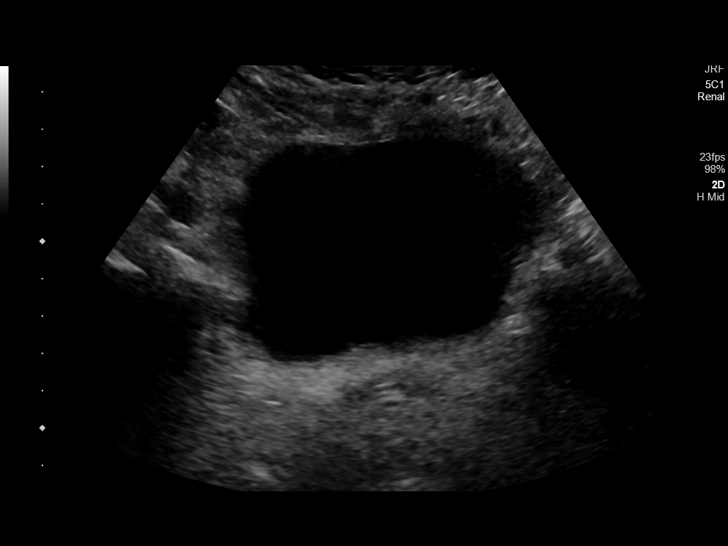

[14 of 25 positions shown; findings below may reference images not displayed]

FINDINGS: Right Kidney:

Renal measurements: 10.4 x 3.9 x 5.2 cm = volume: 109 mL. Cortical
thinning. Borderline increased cortical echogenicity. No mass,
hydronephrosis or shadowing calcification.

Left Kidney:

Renal measurements: 10.2 x 5.1 x 4.8 cm = volume: 130 mL. Cortical
thinning. Increased cortical echogenicity. Small cyst at inferior
pole 2.2 x 1.8 x 1.8 cm, simple features. No additional mass or
hydronephrosis. No shadowing calcification.

Bladder:

Appears normal for degree of bladder distention. BILATERAL ureteral
jets visualized.

Other:

None.
IMPRESSION: Cortical thinning and probable medical renal disease changes of both
kidneys.

2.2 cm simple cyst at inferior pole LEFT kidney.

No additional renal or bladder abnormalities.

## 2021-12-10 ENCOUNTER — Ambulatory Visit
Admission: RE | Admit: 2021-12-10 | Discharge: 2021-12-10 | Disposition: A | Discharge status: Home / Self Care | Payer: BC Managed Care – PPO | Source: Ambulatory Visit | Attending: Internal Medicine | Admitting: Internal Medicine

## 2021-12-10 DIAGNOSIS — Z1231 Encounter for screening mammogram for malignant neoplasm of breast: Secondary | ICD-10-CM | POA: Insufficient documentation

## 2022-06-02 ENCOUNTER — Ambulatory Visit
Admission: RE | Admit: 2022-06-02 | Discharge: 2022-06-02 | Disposition: A | Discharge status: Home / Self Care | Payer: BC Managed Care – PPO | Source: Ambulatory Visit | Attending: Oncology | Admitting: Oncology

## 2022-06-02 ENCOUNTER — Inpatient Hospital Stay: Payer: BC Managed Care – PPO | Attending: Oncology | Admitting: Oncology

## 2022-06-02 ENCOUNTER — Inpatient Hospital Stay: Payer: BC Managed Care – PPO

## 2022-06-02 ENCOUNTER — Encounter: Payer: Self-pay | Admitting: Oncology

## 2022-06-02 VITALS — BP 163/76 | HR 73 | Resp 18 | Wt 116.2 lb

## 2022-06-02 DIAGNOSIS — D472 Monoclonal gammopathy: Secondary | ICD-10-CM

## 2022-06-02 DIAGNOSIS — Z79899 Other long term (current) drug therapy: Secondary | ICD-10-CM | POA: Diagnosis not present

## 2022-06-02 DIAGNOSIS — D539 Nutritional anemia, unspecified: Secondary | ICD-10-CM | POA: Insufficient documentation

## 2022-06-02 LAB — COMPREHENSIVE METABOLIC PANEL
ALT: 14 U/L (ref 0–44)
AST: 19 U/L (ref 15–41)
Albumin: 4.4 g/dL (ref 3.5–5.0)
Alkaline Phosphatase: 72 U/L (ref 38–126)
Anion gap: 4 — ABNORMAL LOW (ref 5–15)
BUN: 10 mg/dL (ref 6–20)
CO2: 27 mmol/L (ref 22–32)
Calcium: 8.9 mg/dL (ref 8.9–10.3)
Chloride: 105 mmol/L (ref 98–111)
Creatinine, Ser: 0.65 mg/dL (ref 0.44–1.00)
GFR, Estimated: >60 mL/min (ref >60)
Glucose, Bld: 91 mg/dL (ref 70–99)
Potassium: 4.1 mmol/L (ref 3.5–5.1)
Sodium: 136 mmol/L (ref 135–145)
Total Bilirubin: 0.5 mg/dL (ref 0.3–1.2)
Total Protein: 8.5 g/dL — ABNORMAL HIGH (ref 6.5–8.1)

## 2022-06-02 LAB — IRON AND TIBC
Iron: 76 ug/dL (ref 28–170)
Saturation Ratios: 21 % (ref 10.4–31.8)
TIBC: 363 ug/dL (ref 250–450)
UIBC: 287 ug/dL

## 2022-06-02 LAB — RETICULOCYTES
Immature Retic Fract: 15.1 % (ref 2.3–15.9)
RBC.: 3.38 MIL/uL — ABNORMAL LOW (ref 3.87–5.11)
Retic Count, Absolute: 47.7 10*3/uL (ref 19.0–186.0)
Retic Ct Pct: 1.4 % (ref 0.4–3.1)

## 2022-06-02 LAB — VITAMIN B12: Vitamin B-12: 181 pg/mL (ref 180–914)

## 2022-06-02 LAB — FOLATE: Folate: 13 ng/mL (ref >5.9)

## 2022-06-02 LAB — FERRITIN: Ferritin: 118 ng/mL (ref 11–307)

## 2022-06-02 LAB — LACTATE DEHYDROGENASE: LDH: 164 U/L (ref 98–192)

## 2022-06-02 NOTE — Progress Notes (Signed)
Hematology/Oncology Consult note [facility] [phone] [phone]  Patient Care Team: Perrin Maltese, MD as PCP - General (Internal Medicine)   Name of the patient: Cassandra Carr  [id number]  [birth year]    Reason for referral-MGUS   Referring physician-Dr. 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  At baseline patient has chronic macrocytic anemia with a hemoglobin that has remained stable around 11 at least for the last 3 to 4 years.  White cell count and platelets are normal.  BMP was within normal limits.  She had an SPEP checked which showed an elevated IgG of 1987 with an M spike of 1 g IgG lambda.  She has therefore been referred for MGUS.  Patient did lose about 15 pounds over the last 6 months but now is gradually stabilizing.  She is trying to eat better.  She works 7 days a week.  She also has chronic back pain and shoulder pain.  She sees a Restaurant manager, fast food for this.  She has had an MRI in 2021 which did not show any lytic lesions.  ECOG PS- 1  Pain scale- 0   Review of systems- Review of Systems  Constitutional:  Positive for malaise/fatigue and weight loss. Negative for chills and fever.  HENT:  Negative for congestion, ear discharge and nosebleeds.   Eyes:  Negative for blurred vision.  Respiratory:  Negative for cough, hemoptysis, sputum production, shortness of breath and wheezing.   Cardiovascular:  Negative for chest pain, palpitations, orthopnea and claudication.  Gastrointestinal:  Negative for abdominal pain, blood in stool, constipation, diarrhea, heartburn, melena, nausea and vomiting.  Genitourinary:  Negative for dysuria, flank pain, frequency, hematuria and urgency.  Musculoskeletal:  Negative for back pain, joint pain and myalgias.  Skin:  Negative for rash.   Neurological:  Negative for dizziness, tingling, focal weakness, seizures, weakness and headaches.  Endo/Heme/Allergies:  Does not bruise/bleed easily.  Psychiatric/Behavioral:  Negative for depression and suicidal ideas. The patient does not have insomnia.     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   Rheumatoid arthritis [facility])      Past Medical History:  Diagnosis Date   Anemia    Anxiety    Asthma    Headache    History of kidney stones    Kidney stone    Rheumatoid arthritis [facility])      Past Surgical History:  Procedure Laterality Date   ABDOMINAL HYSTERECTOMY     BREAST BIOPSY Left    negative 06/2012   CHOLECYSTECTOMY     CYSTOSCOPY/URETEROSCOPY/HOLMIUM LASER/STENT PLACEMENT Right 02/06/2020   Procedure: CYSTOSCOPY/URETEROSCOPY/HOLMIUM LASER/STENT PLACEMENT;  Surgeon: Billey Co, MD;  Location: [facility] ORS;  Service: Urology;  Laterality: Right;    Social History   Socioeconomic History   Marital status: Married    Spouse name: Not on file   Number of children: Not on file   Years of education: Not on file   Highest education level: Not on file  Occupational History   Not on file  Tobacco Use   Smoking status: Never   Smokeless tobacco: Never  Vaping Use   Vaping Use: Never  used  Substance and Sexual Activity   Alcohol use: Never    Comment: very rare.   Drug use: Never   Sexual activity: Yes    Birth control/protection: None  Other Topics Concern   Not on file  Social History Narrative   Not on file   Social Determinants of Health   Financial Resource Strain: Not on file  Food Insecurity: Not on file  Transportation Needs: Not on file  Physical Activity: Not on file  Stress: Not on file  Social Connections: Not on file  Intimate Partner  Violence: Not on file     Family History  Problem Relation Age of Onset   Lung cancer Mother    Esophageal cancer Father    Heart attack Paternal Grandfather    Breast cancer Neg Hx      Current Outpatient Medications:    alendronate (FOSAMAX) 70 MG tablet, Take 70 mg by mouth once a week., Disp: , Rfl:    ALPRAZolam (XANAX) 0.25 MG tablet, Take 0.25 mg by mouth daily as needed., Disp: , Rfl:    celecoxib (CELEBREX) 200 MG capsule, Take 200 mg by mouth daily. , Disp: , Rfl:    cloNIDine (CATAPRES) 0.3 MG tablet, Take 0.3 mg by mouth daily. In evening, Disp: , Rfl:    COSENTYX SENSOREADY, 300 MG, 150 MG/ML SOAJ, Inject 2 Syringes into the skin every 28 (twenty-eight) days., Disp: , Rfl:    fluticasone (FLOVENT HFA) 110 MCG/ACT inhaler, Inhale 2 puffs into the lungs daily., Disp: , Rfl:    folic acid (FOLVITE) 1 MG tablet, Take 1 mg by mouth daily., Disp: , Rfl:    gabapentin (NEURONTIN) 100 MG capsule, Take 100 mg by mouth daily. , Disp: , Rfl:    methotrexate 2.5 MG tablet, SMARTSIG:5 Tablet(s) By Mouth Once a Week, Disp: , Rfl:    sertraline (ZOLOFT) 100 MG tablet, Take 100 mg by mouth daily. AM, Disp: , Rfl:    SUMAtriptan (IMITREX) 50 MG tablet, Take 50 mg by mouth as needed for migraine (take one may repeat in 6 hours if needed). , Disp: , Rfl:    tamsulosin (FLOMAX) 0.4 MG CAPS capsule, Take 1 capsule (0.4 mg total) by mouth daily., Disp: 30 capsule, Rfl: 0   tiZANidine (ZANAFLEX) 4 MG tablet, Take 4 mg by mouth daily. AM, Disp: , Rfl:    Vitamin D, Ergocalciferol, (DRISDOL) 1.25 MG (50000 UNIT) CAPS capsule, Take 50,000 Units by mouth once a week., Disp: , Rfl:    ondansetron (ZOFRAN ODT) 4 MG disintegrating tablet, Take 1 tablet (4 mg total) by mouth every 8 (eight) hours as needed for nausea or vomiting. (Patient not taking: Reported on 06/02/2022), Disp: 12 tablet, Rfl: 0   Physical exam:  Vitals:   06/02/22 1110  BP: (!) 163/76  Pulse: 73  Resp: 18  SpO2: 100%  Weight:  116 lb 3.2 oz (52.7 kg)   Physical Exam Constitutional:      General: She is not in acute distress. Cardiovascular:     Rate and Rhythm: Normal rate and regular rhythm.     Heart sounds: Normal heart sounds.  Pulmonary:     Effort: Pulmonary effort is normal.     Breath sounds: Normal breath sounds.  Abdominal:     General: Bowel sounds are normal.     Palpations: Abdomen is soft.  Skin:    General: Skin is warm and dry.  Neurological:     Mental Status: She is alert  and oriented to person, place, and time.           Latest Ref Rng & Units 06/02/2022   12:23 PM  CMP  Glucose 70 - 99 mg/dL 91   BUN 6 - 20 mg/dL 10   Creatinine 0.44 - 1.00 mg/dL 0.65   Sodium 135 - 145 mmol/L 136   Potassium 3.5 - 5.1 mmol/L 4.1   Chloride 98 - 111 mmol/L 105   CO2 22 - 32 mmol/L 27   Calcium 8.9 - 10.3 mg/dL 8.9   Total Protein 6.5 - 8.1 g/dL 8.5   Total Bilirubin 0.3 - 1.2 mg/dL 0.5   Alkaline Phos 38 - 126 U/L 72   AST 15 - 41 U/L 19   ALT 0 - 44 U/L 14       Latest Ref Rng & Units 02/02/2020    4:52 PM  CBC  WBC 4.0 - 10.5 K/uL 7.5   Hemoglobin 12.0 - 15.0 g/dL 12.5   Hematocrit 36.0 - 46.0 % 35.6   Platelets 150 - 400 K/uL 144     Assessment and plan- Patient is a 55 y.o. 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  Patient does not have any evidence of hypercalcemia, renal failure or known bone lesions.  She has mild chronic macrocytic anemia which is stable and likely secondary to her underlying psoriatic arthritis.  I will check a CMP, serum free light chains and 24-hour urine protein electrophoresis today.  I will also check bone survey at this time.  I will see her back in 2 to 3 weeks time to discuss the results of her blood work and further management.  If overall labs are consistent with MGUS this can be monitored conservatively without any active treatment   Thank you for this kind  referral and the opportunity to participate in the care of this  Patient   Visit Diagnosis 1. MGUS (monoclonal gammopathy of unknown significance)     Dr. Randa Evens, MD, MPH [facility] at [facility] [phone] 06/02/2022

## 2022-06-03 LAB — HAPTOGLOBIN: Haptoglobin: 36 mg/dL (ref 33–346)

## 2022-06-03 LAB — KAPPA/LAMBDA LIGHT CHAINS
Kappa free light chain: 16.7 mg/L (ref 3.3–19.4)
Kappa, lambda light chain ratio: 0.12 — ABNORMAL LOW (ref 0.26–1.65)
Lambda free light chains: 144.3 mg/L — ABNORMAL HIGH (ref 5.7–26.3)

## 2022-06-16 ENCOUNTER — Other Ambulatory Visit: Payer: Self-pay | Admitting: *Deleted

## 2022-06-16 ENCOUNTER — Other Ambulatory Visit: Payer: Self-pay

## 2022-06-16 DIAGNOSIS — D472 Monoclonal gammopathy: Secondary | ICD-10-CM

## 2022-06-16 DIAGNOSIS — Z79899 Other long term (current) drug therapy: Secondary | ICD-10-CM | POA: Insufficient documentation

## 2022-06-16 DIAGNOSIS — D539 Nutritional anemia, unspecified: Secondary | ICD-10-CM | POA: Diagnosis not present

## 2022-06-16 DIAGNOSIS — E538 Deficiency of other specified B group vitamins: Secondary | ICD-10-CM | POA: Insufficient documentation

## 2022-06-18 LAB — IFE+PROTEIN ELECTRO, 24-HR UR
% BETA, Urine: 30.9 %
ALPHA 1 URINE: 7.3 %
Albumin, U: 25.9 %
Alpha 2, Urine: 12.7 %
GAMMA GLOBULIN URINE: 23.2 %
M-SPIKE %, Urine: 12.5 % — ABNORMAL HIGH
M-Spike, Mg/24 Hr: 9 mg/24 hr — ABNORMAL HIGH
Total Protein, Urine-Ur/day: 68 mg/24 hr (ref 30–150)
Total Protein, Urine: 5.7 mg/dL
Total Volume: 1200

## 2022-06-20 ENCOUNTER — Encounter: Payer: Self-pay | Admitting: Oncology

## 2022-06-20 ENCOUNTER — Inpatient Hospital Stay: Payer: BC Managed Care – PPO | Attending: Oncology | Admitting: Oncology

## 2022-06-20 DIAGNOSIS — R778 Other specified abnormalities of plasma proteins: Secondary | ICD-10-CM | POA: Diagnosis not present

## 2022-06-20 DIAGNOSIS — R768 Other specified abnormal immunological findings in serum: Secondary | ICD-10-CM

## 2022-06-22 NOTE — Progress Notes (Signed)
I connected with Cassandra Carr on 06/22/22 at  3:15 PM EST by video enabled telemedicine visit and verified that I am speaking with the correct person using two identifiers.   I discussed the limitations, risks, security and privacy concerns of performing an evaluation and management service by telemedicine and the availability of in-person appointments. I also discussed with the patient that there may be a patient responsible charge related to this service. The patient expressed understanding and agreed to proceed.  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  At baseline patient has chronic macrocytic anemia with a hemoglobin that has remained stable around 11 at least for the last 3 to 4 years.  White cell count and platelets are normal.  BMP was within normal limits.  She had an SPEP checked which showed an elevated IgG of 1987 with an M spike of 1 g IgG lambda.  She has therefore been referred for MGUS.   Patient did lose about 15 pounds over the last 6 months but now is gradually stabilizing.  She is trying to eat better.  She works 7 days a week.  She also has chronic back pain and shoulder pain.  She sees a Restaurant manager, fast food for this.  She has had an MRI in 2021 which did not show any lytic lesions.  Interval history no acute issues since last visit. She has baseline fatigue and some recent weight loss   Review of Systems  Constitutional:  Positive for malaise/fatigue. Negative for chills, fever and weight loss.  HENT:  Negative for congestion, ear discharge and nosebleeds.   Eyes:  Negative for blurred vision.  Respiratory:  Negative for cough, hemoptysis, sputum production, shortness of  breath and wheezing.   Cardiovascular:  Negative for chest pain, palpitations, orthopnea and claudication.  Gastrointestinal:  Negative for abdominal pain, blood in stool, constipation, diarrhea, heartburn, melena, nausea and vomiting.  Genitourinary:  Negative for dysuria, flank pain, frequency, hematuria and urgency.  Musculoskeletal:  Negative for back pain, joint pain and myalgias.  Skin:  Negative for rash.  Neurological:  Negative for dizziness, tingling, focal weakness, seizures, weakness and headaches.  Endo/Heme/Allergies:  Does not bruise/bleed easily.  Psychiatric/Behavioral:  Negative for depression and suicidal ideas. The patient does not have insomnia.     No Known Allergies  Past Medical History:  Diagnosis Date   Anemia    Anxiety    Asthma    Headache    History of kidney stones    Kidney stone    Rheumatoid arthritis [facility])     Past Surgical History:  Procedure Laterality Date   ABDOMINAL HYSTERECTOMY     BREAST BIOPSY Left    negative 06/2012   CHOLECYSTECTOMY     CYSTOSCOPY/URETEROSCOPY/HOLMIUM LASER/STENT PLACEMENT Right 02/06/2020   Procedure: CYSTOSCOPY/URETEROSCOPY/HOLMIUM LASER/STENT PLACEMENT;  Surgeon: Billey Co, MD;  Location: [facility] ORS;  Service: Urology;  Laterality: Right;    Social History   Socioeconomic History   Marital status: Married    Spouse name: Not on file   Number of children: Not on file   Years of education: Not on file   Highest education level: Not on file  Occupational History   Not on file  Tobacco Use  Smoking status: Never   Smokeless tobacco: Never  Vaping Use   Vaping Use: Never used  Substance and Sexual Activity   Alcohol use: Never    Comment: very rare.   Drug use: Never   Sexual activity: Yes    Birth control/protection: None  Other Topics Concern   Not on file  Social History Narrative   Not on file   Social Determinants of Health   Financial Resource Strain: Not on file  Food Insecurity: Not  on file  Transportation Needs: Not on file  Physical Activity: Not on file  Stress: Not on file  Social Connections: Not on file  Intimate Partner Violence: Not on file    Family History  Problem Relation Age of Onset   Lung cancer Mother    Esophageal cancer Father    Heart attack Paternal Grandfather    Breast cancer Neg Hx      Current Outpatient Medications:    alendronate (FOSAMAX) 70 MG tablet, Take 70 mg by mouth once a week., Disp: , Rfl:    ALPRAZolam (XANAX) 0.25 MG tablet, Take 0.25 mg by mouth daily as needed., Disp: , Rfl:    celecoxib (CELEBREX) 200 MG capsule, Take 200 mg by mouth daily. , Disp: , Rfl:    cloNIDine (CATAPRES) 0.3 MG tablet, Take 0.3 mg by mouth daily. In evening, Disp: , Rfl:    COSENTYX SENSOREADY, 300 MG, 150 MG/ML SOAJ, Inject 2 Syringes into the skin every 28 (twenty-eight) days., Disp: , Rfl:    fluticasone (FLOVENT HFA) 110 MCG/ACT inhaler, Inhale 2 puffs into the lungs daily., Disp: , Rfl:    folic acid (FOLVITE) 1 MG tablet, Take 1 mg by mouth daily., Disp: , Rfl:    gabapentin (NEURONTIN) 100 MG capsule, Take 100 mg by mouth daily. , Disp: , Rfl:    methotrexate 2.5 MG tablet, SMARTSIG:5 Tablet(s) By Mouth Once a Week, Disp: , Rfl:    ondansetron (ZOFRAN ODT) 4 MG disintegrating tablet, Take 1 tablet (4 mg total) by mouth every 8 (eight) hours as needed for nausea or vomiting. (Patient not taking: Reported on 06/02/2022), Disp: 12 tablet, Rfl: 0   sertraline (ZOLOFT) 100 MG tablet, Take 100 mg by mouth daily. AM, Disp: , Rfl:    SUMAtriptan (IMITREX) 50 MG tablet, Take 50 mg by mouth as needed for migraine (take one may repeat in 6 hours if needed). , Disp: , Rfl:    tamsulosin (FLOMAX) 0.4 MG CAPS capsule, Take 1 capsule (0.4 mg total) by mouth daily., Disp: 30 capsule, Rfl: 0   tiZANidine (ZANAFLEX) 4 MG tablet, Take 4 mg by mouth daily. AM, Disp: , Rfl:    Vitamin D, Ergocalciferol, (DRISDOL) 1.25 MG (50000 UNIT) CAPS capsule, Take 50,000  Units by mouth once a week., Disp: , Rfl:   DG Bone Survey Met  Result Date: 06/03/2022 CLINICAL DATA:  Monoclonal gammopathy. EXAM: METASTATIC BONE SURVEY COMPARISON:  CT 02/02/2020.  MRI lumbar spine 01/19/2020. FINDINGS: Standard bone survey obtained with imaging of the axial and appendicular skeleton. Chest x-ray reveals symmetric nodular densities over each lung base, most likely prominent nipple shadows. Diffuse severe osteopenia. Several tiny lucencies noted in the skull, these could represent vascular markings. Tiny active lesions cannot be completely excluded. Punctate lucency noted the left ischium, an active lesion cannot be excluded. Thoracolumbar spine scoliosis. Multifocal degenerative change present. Surgical clips in the abdomen. Bilateral small renal stones cannot be excluded. Pelvic calcifications consistent with phleboliths. IMPRESSION: 1. Several  tiny lucencies are noted in the skull, these could represent vascular markings. Tiny active lesions cannot be completely excluded. Punctate lucency noted the left ischium, an active lesion cannot be excluded. 2.  Diffuse severe osteopenia. Electronically Signed   By: Marcello Moores  Register M.D.   On: 06/03/2022 09:35    No images are attached to the encounter.      Latest Ref Rng & Units 06/02/2022   12:23 PM  CMP  Glucose 70 - 99 mg/dL 91   BUN 6 - 20 mg/dL 10   Creatinine 0.44 - 1.00 mg/dL 0.65   Sodium 135 - 145 mmol/L 136   Potassium 3.5 - 5.1 mmol/L 4.1   Chloride 98 - 111 mmol/L 105   CO2 22 - 32 mmol/L 27   Calcium 8.9 - 10.3 mg/dL 8.9   Total Protein 6.5 - 8.1 g/dL 8.5   Total Bilirubin 0.3 - 1.2 mg/dL 0.5   Alkaline Phos 38 - 126 U/L 72   AST 15 - 41 U/L 19   ALT 0 - 44 U/L 14       Latest Ref Rng & Units 02/02/2020    4:52 PM  CBC  WBC 4.0 - 10.5 K/uL 7.5   Hemoglobin 12.0 - 15.0 g/dL 12.5   Hematocrit 36.0 - 46.0 % 35.6   Platelets 150 - 400 K/uL 144      Observation/objective:appears in no acute distress over  video visit today. Breathing is non labored.   Assessment and plan:Patient is a 56 yr old female with suspected MGUS and this is a visit to discuss blood work results and further. Management  Patient noted to have 1 g of IgG lambda M protein on her SPEP.  She has mild microcytic anemia withHemoglobin of 11.3.  Total protein is mildly elevated at 8.5.  Results of peripheral blood anemia workup showed a mildly low B12 level of 181.  However her serum free light chain ratio of lambda to kappa is elevated at 9 with a serum free lambda light chain of 144.  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  Given that her free light chain ratio is more than 8 she will require a bone marrow biopsy to rule out overt multiple myeloma.  Baseline bone survey showed mild lucencies in her skull and it was not ruled out if these were vascular markings versus suspicious myeloma lesions.  I will plan to get a PET CT scan to evaluate this further.  I will see her back after the results of bone marrow biopsy and PET scan is back.  Again briefly discussed the differences between MGUS, smoldering multiple myeloma and overt multiple myeloma.  We do need to get a PET scan and bone marrow to rule out multiple myeloma at this time.  For her B12 deficiency patient will take 1000 mcg of oral B12 daily.    Follow-up instructions:  I discussed the assessment and treatment plan with the patient. The patient was provided an opportunity to ask questions and all were answered. The patient agreed with the plan and demonstrated an understanding of the instructions.   The patient was advised to call back or seek an in-person evaluation if the symptoms worsen or if the condition fails to improve as anticipated.  I provided 25 minutes of face-to-face video visit time during this encounter, and > 50% was spent counseling as documented under my assessment & plan.  Visit Diagnosis: 1. Abnormal  SPEP   2.  Elevated serum immunoglobulin free light chain level     Dr. Randa Evens, MD, MPH [facility] at [facility] Tel- [phone] 06/22/2022 8:12 AM

## 2022-06-23 ENCOUNTER — Other Ambulatory Visit: Payer: Self-pay

## 2022-06-23 ENCOUNTER — Telehealth: Payer: Self-pay

## 2022-06-23 DIAGNOSIS — D472 Monoclonal gammopathy: Secondary | ICD-10-CM

## 2022-06-23 NOTE — Telephone Encounter (Signed)
Patient scheduled for BMB on 12/28/203 @ 8:30 am patient to arrive at 7:30 am. Patient informed and verbalized understanding.

## 2022-07-02 NOTE — Progress Notes (Signed)
Patient for Bone Marrow Biopsy on Thurs 07/03/2022, I called and spoke with the patient on the  phone and gave pre-procedure instructions. Pt was made aware to be here at 7:30a at the new entrance, NPO after MN prior to procedure as well as driver post procedure/recovery/discharge. Pt stated understanding. Called 07/02/2022

## 2022-07-03 ENCOUNTER — Other Ambulatory Visit: Payer: Self-pay

## 2022-07-03 ENCOUNTER — Ambulatory Visit
Admission: RE | Admit: 2022-07-03 | Discharge: 2022-07-03 | Disposition: A | Discharge status: Home / Self Care | Payer: BC Managed Care – PPO | Source: Ambulatory Visit | Attending: Oncology | Admitting: Oncology

## 2022-07-03 DIAGNOSIS — Z7951 Long term (current) use of inhaled steroids: Secondary | ICD-10-CM | POA: Insufficient documentation

## 2022-07-03 DIAGNOSIS — D472 Monoclonal gammopathy: Secondary | ICD-10-CM | POA: Diagnosis not present

## 2022-07-03 DIAGNOSIS — Z79899 Other long term (current) drug therapy: Secondary | ICD-10-CM | POA: Diagnosis not present

## 2022-07-03 DIAGNOSIS — L4050 Arthropathic psoriasis, unspecified: Secondary | ICD-10-CM | POA: Diagnosis not present

## 2022-07-03 DIAGNOSIS — F419 Anxiety disorder, unspecified: Secondary | ICD-10-CM | POA: Diagnosis not present

## 2022-07-03 DIAGNOSIS — D696 Thrombocytopenia, unspecified: Secondary | ICD-10-CM | POA: Insufficient documentation

## 2022-07-03 DIAGNOSIS — Z87442 Personal history of urinary calculi: Secondary | ICD-10-CM | POA: Diagnosis not present

## 2022-07-03 DIAGNOSIS — D539 Nutritional anemia, unspecified: Secondary | ICD-10-CM | POA: Insufficient documentation

## 2022-07-03 DIAGNOSIS — M069 Rheumatoid arthritis, unspecified: Secondary | ICD-10-CM | POA: Diagnosis not present

## 2022-07-03 DIAGNOSIS — Z9049 Acquired absence of other specified parts of digestive tract: Secondary | ICD-10-CM | POA: Diagnosis not present

## 2022-07-03 DIAGNOSIS — J45909 Unspecified asthma, uncomplicated: Secondary | ICD-10-CM | POA: Diagnosis not present

## 2022-07-03 HISTORY — DX: Arthropathic psoriasis, unspecified: L40.50

## 2022-07-03 LAB — CBC WITH DIFFERENTIAL/PLATELET
Abs Immature Granulocytes: 0.02 10*3/uL (ref 0.00–0.07)
Basophils Absolute: 0 10*3/uL (ref 0.0–0.1)
Basophils Relative: 0 %
Eosinophils Absolute: 0 10*3/uL (ref 0.0–0.5)
Eosinophils Relative: 1 %
HCT: 36.8 % (ref 36.0–46.0)
Hemoglobin: 12.4 g/dL (ref 12.0–15.0)
Immature Granulocytes: 0 %
Lymphocytes Relative: 18 %
Lymphs Abs: 1 10*3/uL (ref 0.7–4.0)
MCH: 34.9 pg — ABNORMAL HIGH (ref 26.0–34.0)
MCHC: 33.7 g/dL (ref 30.0–36.0)
MCV: 103.7 fL — ABNORMAL HIGH (ref 80.0–100.0)
Monocytes Absolute: 0.2 10*3/uL (ref 0.1–1.0)
Monocytes Relative: 3 %
Neutro Abs: 4.5 10*3/uL (ref 1.7–7.7)
Neutrophils Relative %: 78 %
Platelets: 147 10*3/uL — ABNORMAL LOW (ref 150–400)
RBC: 3.55 MIL/uL — ABNORMAL LOW (ref 3.87–5.11)
RDW: 13.2 % (ref 11.5–15.5)
WBC: 5.8 10*3/uL (ref 4.0–10.5)
nRBC: 0 % (ref 0.0–0.2)

## 2022-07-03 MED ORDER — SODIUM CHLORIDE 0.9 % IV SOLN: INTRAVENOUS | Status: DC

## 2022-07-03 MED ORDER — FENTANYL CITRATE (PF) 100 MCG/2ML IJ SOLN
INTRAMUSCULAR | Status: AC
  Filled 2022-07-03: qty 2

## 2022-07-03 MED ORDER — MIDAZOLAM HCL 2 MG/2ML IJ SOLN
INTRAMUSCULAR | Status: AC | PRN
  Administered 2022-07-03: 1 mg via INTRAVENOUS

## 2022-07-03 MED ORDER — MIDAZOLAM HCL 5 MG/5ML IJ SOLN
INTRAMUSCULAR | Status: AC | PRN
  Administered 2022-07-03: 1 mg via INTRAVENOUS

## 2022-07-03 MED ORDER — HEPARIN SOD (PORK) LOCK FLUSH 100 UNIT/ML IV SOLN
INTRAVENOUS | Status: AC
  Filled 2022-07-03: qty 5

## 2022-07-03 MED ORDER — MIDAZOLAM HCL 2 MG/2ML IJ SOLN
INTRAMUSCULAR | Status: AC
  Filled 2022-07-03: qty 2

## 2022-07-03 MED ORDER — FENTANYL CITRATE (PF) 100 MCG/2ML IJ SOLN
INTRAMUSCULAR | Status: AC | PRN
  Administered 2022-07-03 (×2): 50 ug via INTRAVENOUS

## 2022-07-03 NOTE — Consult Note (Signed)
Chief Complaint: MGUS  Referring Physician(s): Rao,Archana C  Patient Status: [facility] - Out-pt  History of Present Illness: Cassandra Carr is a 56 y.o. 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  She also has history of chronic macrocytic anemia with a hemoglobin that has remained stable around 11 at least for the last 3 to 4 years.  She feels well today, but is nervous about the procedure.  She denies fever, chills, chest pain, or shortness of breath.   Past Medical History:  Diagnosis Date   Anemia    Anxiety    Asthma    Headache    History of kidney stones    Kidney stone    Psoriatic arthritis ([place])    Rheumatoid arthritis [facility])     Past Surgical History:  Procedure Laterality Date   ABDOMINAL HYSTERECTOMY     BREAST BIOPSY Left    negative 06/2012   CHOLECYSTECTOMY     CYSTOSCOPY/URETEROSCOPY/HOLMIUM LASER/STENT PLACEMENT Right 02/06/2020   Procedure: CYSTOSCOPY/URETEROSCOPY/HOLMIUM LASER/STENT PLACEMENT;  Surgeon: Billey Co, MD;  Location: [facility] ORS;  Service: Urology;  Laterality: Right;    Allergies: Patient has no known allergies.  Medications: Prior to Admission medications   Medication Sig Start Date End Date Taking? Authorizing Provider  celecoxib (CELEBREX) 200 MG capsule Take 200 mg by mouth daily.  09/01/19  Yes [provider]  cloNIDine (CATAPRES) 0.3 MG tablet Take 0.3 mg by mouth daily. In evening 01/14/16  Yes [provider]  COSENTYX SENSOREADY, 300 MG, 150 MG/ML SOAJ Inject 2 Syringes into the skin every 28 (twenty-eight) days. 01/26/20  Yes [provider]  fluticasone (FLOVENT HFA) 110 MCG/ACT inhaler Inhale 2 puffs into the lungs daily.   Yes [provider]  folic acid (FOLVITE) 1 MG tablet Take 1 mg by mouth daily.   Yes [provider]  gabapentin (NEURONTIN) 100 MG capsule Take 100 mg by mouth daily.  04/22/19  Yes  [provider]  sertraline (ZOLOFT) 100 MG tablet Take 100 mg by mouth daily. AM 07/30/15  Yes [provider]  Vitamin D, Ergocalciferol, (DRISDOL) 1.25 MG (50000 UNIT) CAPS capsule Take 50,000 Units by mouth once a week. 11/28/19  Yes [provider]  alendronate (FOSAMAX) 70 MG tablet Take 70 mg by mouth once a week. 12/27/19   [provider]  ALPRAZolam Duanne Moron) 0.25 MG tablet Take 0.25 mg by mouth daily as needed. 01/25/20   [provider]  methotrexate 2.5 MG tablet SMARTSIG:5 Tablet(s) By Mouth Once a Week 01/23/20   [provider]  ondansetron (ZOFRAN ODT) 4 MG disintegrating tablet Take 1 tablet (4 mg total) by mouth every 8 (eight) hours as needed for nausea or vomiting. Patient not taking: Reported on 06/02/2022 02/02/20   Blake Divine, MD  SUMAtriptan (IMITREX) 50 MG tablet Take 50 mg by mouth as needed for migraine (take one may repeat in 6 hours if needed).  05/10/15   [provider]  tamsulosin (FLOMAX) 0.4 MG CAPS capsule Take 1 capsule (0.4 mg total) by mouth daily. Patient not taking: Reported on 07/03/2022 02/03/20   Debroah Loop, PA-C  tiZANidine (ZANAFLEX) 4 MG tablet Take 4 mg by mouth daily. AM 12/20/19   [provider]     Family History  Problem Relation Age of Onset   Lung cancer Mother    Esophageal cancer Father    Heart attack Paternal Grandfather  Breast cancer Neg Hx     Social History   Socioeconomic History   Marital status: Married    Spouse name: Not on file   Number of children: Not on file   Years of education: Not on file   Highest education level: Not on file  Occupational History   Not on file  Tobacco Use   Smoking status: Never   Smokeless tobacco: Never  Vaping Use   Vaping Use: Never used  Substance and Sexual Activity   Alcohol use: Never    Comment: very rare.   Drug use: Never   Sexual activity: Yes    Birth control/protection: None  Other Topics  Concern   Not on file  Social History Narrative   Not on file   Social Determinants of Health   Financial Resource Strain: Not on file  Food Insecurity: Not on file  Transportation Needs: Not on file  Physical Activity: Not on file  Stress: Not on file  Social Connections: Not on file   Review of Systems: A 12 point ROS discussed and pertinent positives are indicated in the HPI above.  All other systems are negative.  Review of Systems  Vital Signs: BP (!) 164/80   Pulse 98   Resp 17   SpO2 100%    Physical Exam Constitutional:      Appearance: Normal appearance.  Cardiovascular:     Rate and Rhythm: Normal rate and regular rhythm.     Heart sounds: Normal heart sounds.  Pulmonary:     Effort: Pulmonary effort is normal.     Breath sounds: Normal breath sounds.  Abdominal:     General: Abdomen is flat. Bowel sounds are normal.     Palpations: Abdomen is soft.  Skin:    General: Skin is warm and dry.  Neurological:     Mental Status: She is alert and oriented to person, place, and time.  Psychiatric:        Mood and Affect: Mood normal.        Behavior: Behavior normal.   Mallampati 1 ASA 2  Imaging: No results found.  Labs:  CBC: Recent Labs    07/03/22 0813  WBC 5.8  HGB 12.4  HCT 36.8  PLT 147*    COAGS: No results for input(s): "INR", "APTT" in the last 8760 hours.  BMP: Recent Labs    06/02/22 1223  NA 136  K 4.1  CL 105  CO2 27  GLUCOSE 91  BUN 10  CALCIUM 8.9  CREATININE 0.65  GFRNONAA >60    LIVER FUNCTION TESTS: Recent Labs    06/02/22 1223  BILITOT 0.5  AST 19  ALT 14  ALKPHOS 72  PROT 8.5*  ALBUMIN 4.4    TUMOR MARKERS: No results for input(s): "AFPTM", "CEA", "CA199", "CHROMGRNA" in the last 8760 hours.  Assessment and Plan:  56 year old woman with history of chronic anemia and  new evidence of MGUS presents to IR for bone marrow biopsy and aspiration.  Risks and benefits of bone marrow biopsy and  aspiration was discussed with the patient including, but not limited to bleeding, infection, damage to adjacent structures or low yield requiring additional tests.  All of the questions were answered and there is agreement to proceed.  Consent signed and in chart.   Thank you for this interesting consult.  I greatly enjoyed meeting Cassandra Carr and look forward to participating in their care.  A copy of this report was  sent to the requesting provider on this date.  Electronically Signed: Paula Libra Kalden Wanke, MD 07/03/2022, 8:28 AM   I spent a total of  15 Minutes  in face to face in clinical consultation, greater than 50% of which was counseling/coordinating care for Bone marrow biopsy and aspiration.

## 2022-07-03 NOTE — Procedures (Signed)
Interventional Radiology Procedure Note  Indication: MGUS  Procedure: CT guided aspirate and core biopsy of right iliac bone  Complications: None  Bleeding: Minimal  Miachel Roux, MD [phone]

## 2022-07-03 NOTE — Progress Notes (Signed)
Patient clinically stable post BMB per Dr Dwaine Gale, tolerated well. Vitals stable. Received Versed 2 mg along with Fentanyl 100 mcg IV for procedure. Report given to Carlynn Spry RN post procedure/331

## 2022-07-08 ENCOUNTER — Ambulatory Visit
Admission: RE | Admit: 2022-07-08 | Discharge: 2022-07-08 | Disposition: A | Discharge status: Home / Self Care | Payer: BC Managed Care – PPO | Source: Ambulatory Visit | Attending: Oncology | Admitting: Oncology

## 2022-07-08 DIAGNOSIS — R768 Other specified abnormal immunological findings in serum: Secondary | ICD-10-CM | POA: Diagnosis not present

## 2022-07-08 DIAGNOSIS — R778 Other specified abnormalities of plasma proteins: Secondary | ICD-10-CM | POA: Diagnosis not present

## 2022-07-08 LAB — GLUCOSE, CAPILLARY: Glucose-Capillary: 90 mg/dL (ref 70–99)

## 2022-07-08 MED ORDER — FLUDEOXYGLUCOSE F - 18 (FDG) INJECTION
6.0000 | Freq: Once | INTRAVENOUS | Status: AC
  Administered 2022-07-08: 6.6 via INTRAVENOUS

## 2022-07-09 LAB — SURGICAL PATHOLOGY

## 2022-07-11 ENCOUNTER — Encounter (HOSPITAL_COMMUNITY): Payer: Self-pay | Admitting: Oncology

## 2022-07-14 ENCOUNTER — Encounter (HOSPITAL_COMMUNITY): Payer: Self-pay | Admitting: Oncology

## 2022-07-16 ENCOUNTER — Inpatient Hospital Stay: Payer: BC Managed Care – PPO | Attending: Oncology | Admitting: Oncology

## 2022-07-16 ENCOUNTER — Encounter: Payer: Self-pay | Admitting: Oncology

## 2022-07-16 VITALS — BP 147/68 | HR 69 | Temp 97.1°F | Resp 16 | Ht 61.0 in | Wt 118.0 lb

## 2022-07-16 DIAGNOSIS — D472 Monoclonal gammopathy: Secondary | ICD-10-CM | POA: Diagnosis not present

## 2022-07-17 NOTE — Progress Notes (Signed)
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 At baseline patient has chronic macrocytic anemia with a hemoglobin that has remained stable around 11 at least for the last 3 to 4 years. White cell count and platelets are normal. BMP was within normal limits. She had an SPEP checked which showed an elevated IgG of 1987 with an M spike of 1 g IgG lambda. Further results of blood work done for myeloma workup showed an elevated lambda light chain of 144 with a lambda kappa ratio of 8.  Anemia workup was otherwise unremarkable except for a low B12 level of 181.  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  Interval history-patient is on methotrexate for her psoriatic arthritis.  She has ongoing fatigue.  Weight has stabilized in the last 2 months.  ECOG PS- 1 Pain scale- 0   Review of systems- Review of Systems  Constitutional:  Positive for malaise/fatigue. Negative for chills, fever and weight loss.  HENT:  Negative for congestion, ear discharge and nosebleeds.   Eyes:  Negative for blurred vision.  Respiratory:  Negative for cough, hemoptysis, sputum production, shortness of breath and wheezing.   Cardiovascular:  Negative for chest pain, palpitations, orthopnea and claudication.  Gastrointestinal:  Negative for abdominal pain, blood in stool, constipation, diarrhea, heartburn, melena, nausea and vomiting.  Genitourinary:  Negative for dysuria,  flank pain, frequency, hematuria and urgency.  Musculoskeletal:  Positive for joint pain. Negative for back pain and myalgias.  Skin:  Negative for rash.  Neurological:  Negative for dizziness, tingling, focal weakness, seizures, weakness and headaches.  Endo/Heme/Allergies:  Does not bruise/bleed easily.  Psychiatric/Behavioral:  Negative for depression and suicidal ideas. The patient does not have insomnia.       No Known Allergies   Past Medical History:  Diagnosis Date   Anemia    Anxiety    Asthma    Headache    History of kidney stones    Kidney stone    Psoriatic arthritis ([place])    Rheumatoid arthritis [facility])      Past Surgical History:  Procedure Laterality Date   ABDOMINAL HYSTERECTOMY     BREAST BIOPSY Left    negative 06/2012   CHOLECYSTECTOMY     CYSTOSCOPY/URETEROSCOPY/HOLMIUM LASER/STENT PLACEMENT Right 02/06/2020   Procedure: CYSTOSCOPY/URETEROSCOPY/HOLMIUM LASER/STENT PLACEMENT;  Surgeon: Billey Co, MD;  Location: [facility] ORS;  Service: Urology;  Laterality: Right;    Social History   Socioeconomic History   Marital status: Married    Spouse name: Not on file   Number of children: Not on file   Years of education: Not on file   Highest education level: Not on file  Occupational History   Not on file  Tobacco Use   Smoking status: Never   Smokeless tobacco: Never  Vaping Use   Vaping Use: Never used  Substance and Sexual Activity   Alcohol use: Never    Comment: very rare.   Drug use: Never   Sexual activity: Yes    Birth control/protection:  None  Other Topics Concern   Not on file  Social History Narrative   Not on file   Social Determinants of Health   Financial Resource Strain: Not on file  Food Insecurity: Not on file  Transportation Needs: Not on file  Physical Activity: Not on file  Stress: Not on file  Social Connections: Not on file  Intimate Partner Violence: Not on file    Family History  Problem Relation Age of Onset    Lung cancer Mother    Esophageal cancer Father    Heart attack Paternal Grandfather    Breast cancer Neg Hx      Current Outpatient Medications:    alendronate (FOSAMAX) 70 MG tablet, Take 70 mg by mouth once a week., Disp: , Rfl:    ALPRAZolam (XANAX) 0.25 MG tablet, Take 0.25 mg by mouth daily as needed., Disp: , Rfl:    celecoxib (CELEBREX) 200 MG capsule, Take 200 mg by mouth daily. , Disp: , Rfl:    cloNIDine (CATAPRES) 0.3 MG tablet, Take 0.3 mg by mouth daily. In evening, Disp: , Rfl:    COSENTYX SENSOREADY, 300 MG, 150 MG/ML SOAJ, Inject 2 Syringes into the skin every 28 (twenty-eight) days., Disp: , Rfl:    fluticasone (FLOVENT HFA) 110 MCG/ACT inhaler, Inhale 2 puffs into the lungs daily., Disp: , Rfl:    folic acid (FOLVITE) 1 MG tablet, Take 1 mg by mouth daily., Disp: , Rfl:    gabapentin (NEURONTIN) 100 MG capsule, Take 100 mg by mouth daily. , Disp: , Rfl:    methotrexate 2.5 MG tablet, SMARTSIG:5 Tablet(s) By Mouth Once a Week, Disp: , Rfl:    ondansetron (ZOFRAN ODT) 4 MG disintegrating tablet, Take 1 tablet (4 mg total) by mouth every 8 (eight) hours as needed for nausea or vomiting. (Patient not taking: Reported on 06/02/2022), Disp: 12 tablet, Rfl: 0   sertraline (ZOLOFT) 100 MG tablet, Take 100 mg by mouth daily. AM, Disp: , Rfl:    SUMAtriptan (IMITREX) 50 MG tablet, Take 50 mg by mouth as needed for migraine (take one may repeat in 6 hours if needed). , Disp: , Rfl:    tamsulosin (FLOMAX) 0.4 MG CAPS capsule, Take 1 capsule (0.4 mg total) by mouth daily. (Patient not taking: Reported on 07/03/2022), Disp: 30 capsule, Rfl: 0   tiZANidine (ZANAFLEX) 4 MG tablet, Take 4 mg by mouth daily. AM, Disp: , Rfl:    Vitamin D, Ergocalciferol, (DRISDOL) 1.25 MG (50000 UNIT) CAPS capsule, Take 50,000 Units by mouth once a week., Disp: , Rfl:   Physical exam:  Vitals:   07/16/22 1458  BP: (!) 147/68  Pulse: 69  Resp: 16  Temp: (!) 97.1 F (36.2 C)  TempSrc: Tympanic   SpO2: 100%  Weight: 118 lb (53.5 kg)  Height: '5\' 1"'$  (1.549 m)   Physical Exam Constitutional:      General: She is not in acute distress. Cardiovascular:     Rate and Rhythm: Normal rate and regular rhythm.     Heart sounds: Normal heart sounds.  Pulmonary:     Effort: Pulmonary effort is normal.     Breath sounds: Normal breath sounds.  Skin:    General: Skin is warm and dry.  Neurological:     Mental Status: She is alert and oriented to person, place, and time.         Latest Ref Rng & Units 06/02/2022   12:23 PM  CMP  Glucose 70 - 99 mg/dL  91   BUN 6 - 20 mg/dL 10   Creatinine 0.44 - 1.00 mg/dL 0.65   Sodium 135 - 145 mmol/L 136   Potassium 3.5 - 5.1 mmol/L 4.1   Chloride 98 - 111 mmol/L 105   CO2 22 - 32 mmol/L 27   Calcium 8.9 - 10.3 mg/dL 8.9   Total Protein 6.5 - 8.1 g/dL 8.5   Total Bilirubin 0.3 - 1.2 mg/dL 0.5   Alkaline Phos 38 - 126 U/L 72   AST 15 - 41 U/L 19   ALT 0 - 44 U/L 14       Latest Ref Rng & Units 07/03/2022    8:13 AM  CBC  WBC 4.0 - 10.5 K/uL 5.8   Hemoglobin 12.0 - 15.0 g/dL 12.4   Hematocrit 36.0 - 46.0 % 36.8   Platelets 150 - 400 K/uL 147       NM PET Image Initial (PI) Skull Base To Thigh  Result Date: 07/09/2022 CLINICAL DATA:  Initial treatment strategy for elevated serum immunoglobulin light chain. Recent bone marrow biopsy. EXAM: NUCLEAR MEDICINE PET SKULL BASE TO THIGH TECHNIQUE: 6.6 mCi F-18 FDG was injected intravenously. Full-ring PET imaging was performed from the skull base to thigh after the radiotracer. CT data was obtained and used for attenuation correction and anatomic localization. Fasting blood glucose: 90 mg/dl COMPARISON:  None Available. FINDINGS: Mediastinal blood pool activity: SUV max 1.5 Liver activity: SUV max 2.1 NECK: No hypermetabolic lymph nodes in the neck. Incidental CT findings: None. CHEST: No hypermetabolic mediastinal or hilar nodes. No suspicious pulmonary nodules on the CT scan. Incidental CT  findings: None. ABDOMEN/PELVIS: No abnormal hypermetabolic activity within the liver, pancreas, adrenal glands, or spleen. No hypermetabolic lymph nodes in the abdomen or pelvis. Normal spleen volume and metabolic activity. Incidental CT findings: None. SKELETON: No abnormal metabolic activity in the bone marrow. No suspicious lytic or sclerotic lesion. Incidental CT findings: None. IMPRESSION: 1. No evidence of hematologic malignancy in the neck, chest, abdomen, or pelvis. 2. Normal spleen. 3. No abnormal metabolic activity in the bone marrow. Electronically Signed   By: Suzy Bouchard M.D.   On: 07/09/2022 15:48   CT BONE MARROW BIOPSY & ASPIRATION  Result Date: 07/03/2022 INDICATION: MGUS Chronic anemia EXAM: CT GUIDED BONE MARROW ASPIRATES AND BIOPSY MEDICATIONS: None. ANESTHESIA/SEDATION: Fentanyl 100 mcg IV; Versed 2 mg IV Moderate Sedation Time:  12 minutes The patient was continuously monitored during the procedure by the interventional radiology nurse under my direct supervision. COMPLICATIONS: None immediate. PROCEDURE: The procedure was explained to the patient. The risks and benefits of the procedure were discussed and the patient's questions were addressed. Informed consent was obtained from the patient. The patient was placed prone on CT table. Images of the pelvis were obtained. The right side of back was prepped and draped in sterile fashion. The skin and right posterior ilium were anesthetized with 1% lidocaine. 11 gauge bone needle was directed into the right ilium with CT guidance. Two aspirates and 1 core biopsy were obtained. Bandage placed over the puncture site. IMPRESSION: CT guided bone marrow aspiration and core biopsy. Electronically Signed   By: Miachel Roux M.D.   On: 07/03/2022 16:22     Assessment and plan- Patient is a 57 y.o. female with history of IgG lambda smoldering multiple myeloma here to discuss further management  Patient was referred to me for mild macrocytic  anemia.  It is possible that her macrocytosis is secondary to methotrexate or B12  deficiency.  As such she does not have any evidence of hypercalcemia, renal failure or significant anemia.  No lytic bone lesions were noted on the PET scan.  She therefore does not have any crab criteria.  Her lambda light chain was elevated at 144 with a lambda kappa light chain ratio of 8.  She was noted to have 1 g of monoclonal IgG lambda protein on her SPEP.  I have therefore done a bone marrow biopsy to rule out multiple myeloma.  Bone marrow biopsy shows some areas of dyspoiesis likely secondary to methotrexate related changes.  She was noted to have 13% clonal plasma cells.  Since patient has more than 10% clonal plasma cells in the bone marrow this would constitute smoldering multiple myeloma.  She does not meet crab criteria and therefore does not have any overt multiple myeloma.  As per risk stratification of smoldering multiple myeloma she falls in the low risk group given that her free light chain ratio is less than 20, monoclonal protein is less than 2 g and less than 20% clonal plasma cells noted in the bone marrow.  Median time to progression overt multiple myeloma would be about 110 months with 5% progression per year over the next 5 years.  Her smoldering multiple myeloma does not require any treatment at this time and can be monitored conservatively.  I will repeat CBC with differential, CMP, myeloma panel and serum free light chains in 3 months in 6 months and I will see her back in 6 months.  Discussed natural history of smoldering multiple myeloma and progression to overt multiple myeloma.  Patient verbalized understanding   Visit Diagnosis 1. Smoldering multiple myeloma      Dr. Randa Evens, MD, MPH [facility] at [facility] [phone] 07/17/2022 2:43 PM

## 2022-08-04 LAB — COLOGUARD

## 2022-09-01 ENCOUNTER — Other Ambulatory Visit: Payer: Self-pay | Admitting: Family

## 2022-09-15 LAB — COLOGUARD: COLOGUARD: NEGATIVE

## 2022-09-25 ENCOUNTER — Other Ambulatory Visit: Payer: Self-pay | Admitting: Family

## 2022-09-25 ENCOUNTER — Other Ambulatory Visit: Payer: Self-pay | Admitting: Internal Medicine

## 2022-09-26 ENCOUNTER — Other Ambulatory Visit: Payer: Self-pay | Admitting: Internal Medicine

## 2022-10-01 ENCOUNTER — Other Ambulatory Visit: Payer: Self-pay | Admitting: Family

## 2022-10-15 ENCOUNTER — Inpatient Hospital Stay: Payer: BC Managed Care – PPO | Attending: Oncology

## 2022-10-15 ENCOUNTER — Other Ambulatory Visit: Payer: BC Managed Care – PPO

## 2022-10-15 DIAGNOSIS — D472 Monoclonal gammopathy: Secondary | ICD-10-CM | POA: Insufficient documentation

## 2022-10-15 LAB — CBC WITH DIFFERENTIAL/PLATELET
Abs Immature Granulocytes: 0.01 10*3/uL (ref 0.00–0.07)
Basophils Absolute: 0 10*3/uL (ref 0.0–0.1)
Basophils Relative: 1 %
Eosinophils Absolute: 0.1 10*3/uL (ref 0.0–0.5)
Eosinophils Relative: 3 %
HCT: 33.6 % — ABNORMAL LOW (ref 36.0–46.0)
Hemoglobin: 11.2 g/dL — ABNORMAL LOW (ref 12.0–15.0)
Immature Granulocytes: 0 %
Lymphocytes Relative: 43 %
Lymphs Abs: 1.6 10*3/uL (ref 0.7–4.0)
MCH: 34.8 pg — ABNORMAL HIGH (ref 26.0–34.0)
MCHC: 33.3 g/dL (ref 30.0–36.0)
MCV: 104.3 fL — ABNORMAL HIGH (ref 80.0–100.0)
Monocytes Absolute: 0.4 10*3/uL (ref 0.1–1.0)
Monocytes Relative: 12 %
Neutro Abs: 1.5 10*3/uL — ABNORMAL LOW (ref 1.7–7.7)
Neutrophils Relative %: 41 %
Platelets: 158 10*3/uL (ref 150–400)
RBC: 3.22 MIL/uL — ABNORMAL LOW (ref 3.87–5.11)
RDW: 12.8 % (ref 11.5–15.5)
WBC: 3.6 10*3/uL — ABNORMAL LOW (ref 4.0–10.5)
nRBC: 0 % (ref 0.0–0.2)

## 2022-10-15 LAB — COMPREHENSIVE METABOLIC PANEL
ALT: 15 U/L (ref 0–44)
AST: 19 U/L (ref 15–41)
Albumin: 4.1 g/dL (ref 3.5–5.0)
Alkaline Phosphatase: 66 U/L (ref 38–126)
Anion gap: 7 (ref 5–15)
BUN: 17 mg/dL (ref 6–20)
CO2: 25 mmol/L (ref 22–32)
Calcium: 8.9 mg/dL (ref 8.9–10.3)
Chloride: 104 mmol/L (ref 98–111)
Creatinine, Ser: 0.88 mg/dL (ref 0.44–1.00)
GFR, Estimated: >60 mL/min (ref >60)
Glucose, Bld: 94 mg/dL (ref 70–99)
Potassium: 3.9 mmol/L (ref 3.5–5.1)
Sodium: 136 mmol/L (ref 135–145)
Total Bilirubin: 0.5 mg/dL (ref 0.3–1.2)
Total Protein: 7.7 g/dL (ref 6.5–8.1)

## 2022-10-16 LAB — KAPPA/LAMBDA LIGHT CHAINS
Kappa free light chain: 16.5 mg/L (ref 3.3–19.4)
Kappa, lambda light chain ratio: 0.1 — ABNORMAL LOW (ref 0.26–1.65)
Lambda free light chains: 158 mg/L — ABNORMAL HIGH (ref 5.7–26.3)

## 2022-10-17 LAB — MULTIPLE MYELOMA PANEL, SERUM
Albumin SerPl Elph-Mcnc: 3.9 g/dL (ref 2.9–4.4)
Albumin/Glob SerPl: 1.2 (ref 0.7–1.7)
Alpha 1: 0.2 g/dL (ref 0.0–0.4)
Alpha2 Glob SerPl Elph-Mcnc: 0.6 g/dL (ref 0.4–1.0)
B-Globulin SerPl Elph-Mcnc: 0.9 g/dL (ref 0.7–1.3)
Gamma Glob SerPl Elph-Mcnc: 1.7 g/dL (ref 0.4–1.8)
Globulin, Total: 3.4 g/dL (ref 2.2–3.9)
IgA: 130 mg/dL (ref 87–352)
IgG (Immunoglobin G), Serum: 1750 mg/dL — ABNORMAL HIGH (ref 586–1602)
IgM (Immunoglobulin M), Srm: 47 mg/dL (ref 26–217)
M Protein SerPl Elph-Mcnc: 1.1 g/dL — ABNORMAL HIGH
Total Protein ELP: 7.3 g/dL (ref 6.0–8.5)

## 2022-11-04 ENCOUNTER — Other Ambulatory Visit: Payer: Self-pay | Admitting: Family

## 2022-11-04 ENCOUNTER — Other Ambulatory Visit: Payer: Self-pay | Admitting: Internal Medicine

## 2022-11-12 ENCOUNTER — Other Ambulatory Visit: Payer: Self-pay | Admitting: Family

## 2022-11-13 ENCOUNTER — Ambulatory Visit (INDEPENDENT_AMBULATORY_CARE_PROVIDER_SITE_OTHER): Payer: BC Managed Care – PPO | Admitting: Internal Medicine

## 2022-11-13 ENCOUNTER — Encounter: Payer: Self-pay | Admitting: Internal Medicine

## 2022-11-13 VITALS — BP 122/62 | HR 65 | Ht 61.0 in | Wt 115.0 lb

## 2022-11-13 DIAGNOSIS — Z13820 Encounter for screening for osteoporosis: Secondary | ICD-10-CM

## 2022-11-13 DIAGNOSIS — Z1151 Encounter for screening for human papillomavirus (HPV): Secondary | ICD-10-CM | POA: Diagnosis not present

## 2022-11-13 DIAGNOSIS — D472 Monoclonal gammopathy: Secondary | ICD-10-CM

## 2022-11-13 DIAGNOSIS — F411 Generalized anxiety disorder: Secondary | ICD-10-CM

## 2022-11-13 DIAGNOSIS — Z1231 Encounter for screening mammogram for malignant neoplasm of breast: Secondary | ICD-10-CM | POA: Insufficient documentation

## 2022-11-13 DIAGNOSIS — M85851 Other specified disorders of bone density and structure, right thigh: Secondary | ICD-10-CM

## 2022-11-13 DIAGNOSIS — R232 Flushing: Secondary | ICD-10-CM

## 2022-11-13 DIAGNOSIS — E782 Mixed hyperlipidemia: Secondary | ICD-10-CM

## 2022-11-13 DIAGNOSIS — Z1389 Encounter for screening for other disorder: Secondary | ICD-10-CM | POA: Diagnosis not present

## 2022-11-13 DIAGNOSIS — Z0001 Encounter for general adult medical examination with abnormal findings: Secondary | ICD-10-CM | POA: Diagnosis not present

## 2022-11-13 DIAGNOSIS — L4050 Arthropathic psoriasis, unspecified: Secondary | ICD-10-CM

## 2022-11-13 DIAGNOSIS — E538 Deficiency of other specified B group vitamins: Secondary | ICD-10-CM | POA: Insufficient documentation

## 2022-11-13 DIAGNOSIS — J301 Allergic rhinitis due to pollen: Secondary | ICD-10-CM

## 2022-11-13 DIAGNOSIS — E559 Vitamin D deficiency, unspecified: Secondary | ICD-10-CM

## 2022-11-13 DIAGNOSIS — G43009 Migraine without aura, not intractable, without status migrainosus: Secondary | ICD-10-CM

## 2022-11-13 LAB — POCT URINALYSIS DIPSTICK
Bilirubin, UA: NEGATIVE
Glucose, UA: NEGATIVE
Ketones, UA: NEGATIVE
Leukocytes, UA: NEGATIVE
Nitrite, UA: NEGATIVE
Protein, UA: NEGATIVE
Spec Grav, UA: 1.02 (ref 1.010–1.025)
Urobilinogen, UA: 0.2 E.U./dL
pH, UA: 6.5 (ref 5.0–8.0)

## 2022-11-13 MED ORDER — GABAPENTIN 100 MG PO CAPS: 100.0000 mg | ORAL_CAPSULE | Freq: Every day | ORAL | 3 refills | Status: DC

## 2022-11-13 MED ORDER — CLONIDINE HCL 0.3 MG PO TABS: 0.3000 mg | ORAL_TABLET | Freq: Every day | ORAL | 3 refills | Status: DC

## 2022-11-13 MED ORDER — TIZANIDINE HCL 4 MG PO TABS: 4.0000 mg | ORAL_TABLET | Freq: Every day | ORAL | 6 refills | Status: DC

## 2022-11-13 MED ORDER — SUMATRIPTAN SUCCINATE 50 MG PO TABS
50.0000 mg | ORAL_TABLET | ORAL | 3 refills | Status: AC | PRN
Start: 2022-11-13 — End: ?

## 2022-11-13 MED ORDER — ALENDRONATE SODIUM 70 MG PO TABS: 70.0000 mg | ORAL_TABLET | ORAL | 3 refills | Status: DC

## 2022-11-13 MED ORDER — CELECOXIB 200 MG PO CAPS: 200.0000 mg | ORAL_CAPSULE | Freq: Every day | ORAL | 3 refills | Status: DC

## 2022-11-13 MED ORDER — FLUTICASONE PROPIONATE HFA 110 MCG/ACT IN AERO
2.0000 | INHALATION_SPRAY | Freq: Every day | RESPIRATORY_TRACT | 6 refills | Status: DC
Start: 2022-11-13 — End: 2022-11-24

## 2022-11-13 MED ORDER — SERTRALINE HCL 100 MG PO TABS: 100.0000 mg | ORAL_TABLET | Freq: Every day | ORAL | 3 refills | Status: DC

## 2022-11-13 MED ORDER — ALPRAZOLAM 0.25 MG PO TABS
0.2500 mg | ORAL_TABLET | Freq: Every evening | ORAL | 1 refills | Status: DC | PRN
Start: 2022-11-13 — End: 2023-01-12

## 2022-11-13 NOTE — Progress Notes (Signed)
Established Patient Office Visit  Subjective:  Patient ID: Cassandra Carr, female    DOB: 06-Oct-1965  Age: 57 y.o. MRN: [id number]  Chief Complaint  Patient presents with   Follow-up    refills    Patient comes in for follow-up today.  She has not been in since April of last year.  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  She is being monitored by her oncologist.  No treatment has been started yet.  Patient has lost some weight since last year.  She says she works 2 jobs and at the same time she is taking all her medications regularly. She is due for blood work, breast exam and a Pap smear. Will schedule her mammogram as well as her bone density.  She is up-to-date on Cologuard.    No other concerns at this time.   Past Medical History:  Diagnosis Date   Anemia    Anxiety    Asthma    Headache    History of kidney stones    Kidney stone    Psoriatic arthritis (HCC)    Rheumatoid arthritis (HCC)    Smoldering multiple myeloma     Past Surgical History:  Procedure Laterality Date   ABDOMINAL HYSTERECTOMY     BREAST BIOPSY Left    negative 06/2012   CHOLECYSTECTOMY     CYSTOSCOPY/URETEROSCOPY/HOLMIUM LASER/STENT PLACEMENT Right 02/06/2020   Procedure: CYSTOSCOPY/URETEROSCOPY/HOLMIUM LASER/STENT PLACEMENT;  Surgeon: Sondra Come, MD;  Location: [facility] ORS;  Service: Urology;  Laterality: Right;    Social History   Socioeconomic History   Marital status: Married    Spouse name: Not on file   Number of children: Not on file   Years of education: Not on file   Highest education level: Not on file  Occupational History   Not on file  Tobacco Use   Smoking status: Never   Smokeless tobacco: Never  Vaping Use   Vaping Use: Never used  Substance and Sexual Activity   Alcohol use: Never    Comment: very rare.   Drug use: Never   Sexual activity: Yes    Birth control/protection: None  Other Topics Concern   Not on file  Social  History Narrative   Not on file   Social Determinants of Health   Financial Resource Strain: Not on file  Food Insecurity: Not on file  Transportation Needs: Not on file  Physical Activity: Not on file  Stress: Not on file  Social Connections: Not on file  Intimate Partner Violence: Not on file    Family History  Problem Relation Age of Onset   Lung cancer Mother    Esophageal cancer Father    Heart attack Paternal Grandfather    Breast cancer Neg Hx     No Known Allergies  Review of Systems  Constitutional:  Positive for weight loss. Negative for chills, diaphoresis, fever and malaise/fatigue.  HENT:  Negative for congestion, ear discharge, ear pain, hearing loss, nosebleeds, sinus pain, sore throat and tinnitus.   Eyes:  Negative for blurred vision, double vision, pain, discharge and redness.  Respiratory:  Negative for cough, hemoptysis, shortness of breath, wheezing and stridor.   Cardiovascular:  Negative for chest pain, palpitations, leg swelling and PND.  Gastrointestinal:  Negative for abdominal pain, blood in stool, constipation, diarrhea, heartburn, melena and nausea.  Genitourinary:  Negative for dysuria, flank pain, frequency, hematuria and urgency.  Musculoskeletal:  Positive for joint pain. Negative for falls,  myalgias and neck pain.  Skin: Negative.   Neurological:  Negative for dizziness, tremors, seizures and weakness.  Psychiatric/Behavioral:  Negative for depression, memory loss and substance abuse. The patient is not nervous/anxious and does not have insomnia.        Objective:   BP 122/62   Pulse 65   Ht 5\' 1"  (1.549 m)   Wt 115 lb (52.2 kg)   SpO2 95%   BMI 21.73 kg/m   Vitals:   11/13/22 0918  BP: 122/62  Pulse: 65  Height: 5\' 1"  (1.549 m)  Weight: 115 lb (52.2 kg)  SpO2: 95%  BMI (Calculated): 21.74    Physical Exam Vitals and nursing note reviewed. Exam conducted with a chaperone present.  Constitutional:      Appearance: Normal  appearance.  HENT:     Head: Normocephalic.     Nose: Nose normal.     Mouth/Throat:     Mouth: Mucous membranes are moist.  Eyes:     Pupils: Pupils are equal, round, and reactive to light.  Cardiovascular:     Rate and Rhythm: Normal rate and regular rhythm.     Heart sounds: No murmur heard. Pulmonary:     Effort: Pulmonary effort is normal.     Breath sounds: Normal breath sounds. No wheezing, rhonchi or rales.  Chest:  Breasts:    Right: Normal. No swelling, bleeding, inverted nipple, nipple discharge, skin change or tenderness.     Left: Normal. No swelling, bleeding, inverted nipple, nipple discharge or tenderness.     Comments: Bilateral fibrocystic changes. Abdominal:     General: Bowel sounds are normal. There is no distension.     Palpations: Abdomen is soft. There is no mass.     Tenderness: There is no abdominal tenderness. There is no right CVA tenderness, left CVA tenderness or rebound.     Hernia: There is no hernia in the left inguinal area or right inguinal area.  Genitourinary:    Pubic Area: No rash.      Labia:        Right: No rash.        Left: No rash, lesion or injury.      Vagina: Normal. No signs of injury. No vaginal discharge, erythema, tenderness, bleeding, lesions or prolapsed vaginal walls.     Adnexa:        Right: No mass, tenderness or fullness.         Left: No mass, tenderness or fullness.    Musculoskeletal:        General: No swelling or tenderness. Normal range of motion.     Cervical back: Normal range of motion and neck supple. No rigidity.     Right lower leg: No edema.     Left lower leg: No edema.  Lymphadenopathy:     Upper Body:     Right upper body: No supraclavicular or axillary adenopathy.     Left upper body: No supraclavicular or axillary adenopathy.     Lower Body: No right inguinal adenopathy. No left inguinal adenopathy.  Neurological:     General: No focal deficit present.     Mental Status: She is alert and oriented  to person, place, and time.  Psychiatric:        Mood and Affect: Mood normal.        Behavior: Behavior normal.      Results for orders placed or performed in visit on 11/13/22  POCT Urinalysis Dipstick (81002)  Result Value Ref Range   Color, UA     Clarity, UA     Glucose, UA Negative Negative   Bilirubin, UA Negative    Ketones, UA Negative    Spec Grav, UA 1.020 1.010 - 1.025   Blood, UA Trace-intact    pH, UA 6.5 5.0 - 8.0   Protein, UA Negative Negative   Urobilinogen, UA 0.2 0.2 or 1.0 E.U./dL   Nitrite, UA Negative    Leukocytes, UA Negative Negative   Appearance     Odor          Assessment & Plan:  Patient to continue her medications.  Given refilled.  Schedule mammogram and bone density Problem List Items Addressed This Visit     Hot flashes   Relevant Medications   cloNIDine (CATAPRES) 0.3 MG tablet   Psoriatic arthritis (HCC)   Relevant Medications   celecoxib (CELEBREX) 200 MG capsule   gabapentin (NEURONTIN) 100 MG capsule   tiZANidine (ZANAFLEX) 4 MG tablet   GAD (generalized anxiety disorder)   Relevant Medications   sertraline (ZOLOFT) 100 MG tablet   ALPRAZolam (XANAX) 0.25 MG tablet   Migraine without aura and without status migrainosus, not intractable   Relevant Medications   celecoxib (CELEBREX) 200 MG capsule   cloNIDine (CATAPRES) 0.3 MG tablet   gabapentin (NEURONTIN) 100 MG capsule   sertraline (ZOLOFT) 100 MG tablet   SUMAtriptan (IMITREX) 50 MG tablet   tiZANidine (ZANAFLEX) 4 MG tablet   Encounter for screening mammogram for malignant neoplasm of breast   Relevant Orders   MM 3D SCREENING MAMMOGRAM BILATERAL BREAST   Mixed hyperlipidemia   Relevant Medications   cloNIDine (CATAPRES) 0.3 MG tablet   Other Relevant Orders   Lipid Profile   Vitamin D deficiency   Relevant Orders   Vitamin D (25 hydroxy)   Vitamin B12 deficiency disease   Relevant Orders   Vitamin B12   Smoldering multiple myeloma   Relevant Medications    celecoxib (CELEBREX) 200 MG capsule   gabapentin (NEURONTIN) 100 MG capsule   ALPRAZolam (XANAX) 0.25 MG tablet   Osteopenia of right hip   Relevant Medications   alendronate (FOSAMAX) 70 MG tablet   Non-seasonal allergic rhinitis due to pollen   Relevant Medications   fluticasone (FLOVENT HFA) 110 MCG/ACT inhaler   Other Visit Diagnoses     Screening for blood or protein in urine    -  Primary   Relevant Orders   POCT Urinalysis Dipstick (32951) (Completed)   Encounter for screening for human papillomavirus (HPV)       Relevant Orders   IGP, Aptima HPV, rfx 16/18,45   Screening for osteoporosis       Relevant Orders   DG Bone Density       Return in about 6 months (around 05/16/2023).   Total time spent: 30 minutes  Margaretann Loveless, MD  11/13/2022

## 2022-11-14 LAB — LIPID PANEL
Chol/HDL Ratio: 3.4 ratio (ref 0.0–4.4)
Cholesterol, Total: 193 mg/dL (ref 100–199)
HDL: 56 mg/dL (ref >39)
LDL Chol Calc (NIH): 114 mg/dL — ABNORMAL HIGH (ref 0–99)
Triglycerides: 129 mg/dL (ref 0–149)
VLDL Cholesterol Cal: 23 mg/dL (ref 5–40)

## 2022-11-14 LAB — VITAMIN B12: Vitamin B-12: 652 pg/mL (ref 232–1245)

## 2022-11-14 LAB — VITAMIN D 25 HYDROXY (VIT D DEFICIENCY, FRACTURES): Vit D, 25-Hydroxy: 91.3 ng/mL (ref 30.0–100.0)

## 2022-11-19 LAB — IGP, APTIMA HPV, RFX 16/18,45
HPV Aptima: NEGATIVE
PAP Smear Comment: 0

## 2022-11-19 LAB — SPECIMEN STATUS REPORT

## 2022-11-24 ENCOUNTER — Telehealth: Payer: Self-pay

## 2022-11-24 ENCOUNTER — Other Ambulatory Visit: Payer: Self-pay

## 2022-11-24 ENCOUNTER — Other Ambulatory Visit: Payer: Self-pay | Admitting: Internal Medicine

## 2022-11-24 DIAGNOSIS — J301 Allergic rhinitis due to pollen: Secondary | ICD-10-CM

## 2022-11-24 MED ORDER — FLUTICASONE PROPIONATE HFA 110 MCG/ACT IN AERO
2.0000 | INHALATION_SPRAY | Freq: Every day | RESPIRATORY_TRACT | 6 refills | Status: DC
Start: 2022-11-24 — End: 2022-12-22

## 2022-11-24 MED ORDER — FLUTICASONE PROPIONATE 50 MCG/ACT NA SUSP: 1.0000 | Freq: Every day | NASAL | 6 refills | Status: DC

## 2022-11-24 NOTE — Telephone Encounter (Signed)
Pt called and left vm regarding needing refill on rx flonase nasal spray, & pt's PA for flovent inhaler came back denied. I didn't know if you wanted to send an alternate rx for pt? Please advise

## 2022-11-25 ENCOUNTER — Other Ambulatory Visit: Payer: Self-pay

## 2022-11-25 ENCOUNTER — Other Ambulatory Visit: Payer: BC Managed Care – PPO

## 2022-12-02 ENCOUNTER — Ambulatory Visit (INDEPENDENT_AMBULATORY_CARE_PROVIDER_SITE_OTHER): Payer: BC Managed Care – PPO

## 2022-12-02 DIAGNOSIS — Z13820 Encounter for screening for osteoporosis: Secondary | ICD-10-CM

## 2022-12-19 ENCOUNTER — Ambulatory Visit
Admission: RE | Admit: 2022-12-19 | Discharge: 2022-12-19 | Disposition: A | Discharge status: Home / Self Care | Payer: BC Managed Care – PPO | Source: Ambulatory Visit | Attending: Internal Medicine | Admitting: Internal Medicine

## 2022-12-19 DIAGNOSIS — Z1231 Encounter for screening mammogram for malignant neoplasm of breast: Secondary | ICD-10-CM | POA: Diagnosis present

## 2022-12-22 ENCOUNTER — Telehealth: Payer: Self-pay | Admitting: Internal Medicine

## 2022-12-22 ENCOUNTER — Other Ambulatory Visit: Payer: Self-pay | Admitting: Internal Medicine

## 2022-12-22 MED ORDER — PULMICORT FLEXHALER 90 MCG/ACT IN AEPB: 2.0000 | INHALATION_SPRAY | Freq: Two times a day (BID) | RESPIRATORY_TRACT | 3 refills | Status: DC

## 2022-12-22 NOTE — Telephone Encounter (Signed)
Patient left VM that she needs a Pulmicort Flex inhaler sent to [facility]. Please advise.

## 2022-12-23 ENCOUNTER — Other Ambulatory Visit: Payer: Self-pay | Admitting: Family

## 2022-12-24 ENCOUNTER — Other Ambulatory Visit: Payer: Self-pay | Admitting: Internal Medicine

## 2022-12-26 ENCOUNTER — Telehealth: Payer: Self-pay | Admitting: Internal Medicine

## 2022-12-26 MED ORDER — BUDESONIDE 180 MCG/ACT IN AEPB: 1.0000 | INHALATION_SPRAY | Freq: Two times a day (BID) | RESPIRATORY_TRACT | 2 refills | Status: DC

## 2022-12-26 NOTE — Telephone Encounter (Signed)
Shawna Orleans called to advise that the way the Pulmicort was sent the other day is no longer available. Per Dr. Welton Flakes we will send Pulmicort 180 mcg.

## 2023-01-07 ENCOUNTER — Other Ambulatory Visit: Payer: Self-pay | Admitting: Internal Medicine

## 2023-01-07 DIAGNOSIS — F411 Generalized anxiety disorder: Secondary | ICD-10-CM

## 2023-01-13 ENCOUNTER — Encounter: Payer: Self-pay | Admitting: Family

## 2023-01-14 ENCOUNTER — Inpatient Hospital Stay: Payer: BC Managed Care – PPO | Attending: Oncology

## 2023-01-14 ENCOUNTER — Ambulatory Visit: Payer: BC Managed Care – PPO | Admitting: Oncology

## 2023-01-14 ENCOUNTER — Other Ambulatory Visit: Payer: BC Managed Care – PPO

## 2023-01-14 ENCOUNTER — Inpatient Hospital Stay (HOSPITAL_BASED_OUTPATIENT_CLINIC_OR_DEPARTMENT_OTHER): Payer: BC Managed Care – PPO | Admitting: Oncology

## 2023-01-14 ENCOUNTER — Encounter: Payer: Self-pay | Admitting: Oncology

## 2023-01-14 VITALS — BP 115/58 | HR 71 | Temp 96.8°F | Ht 61.0 in | Wt 116.4 lb

## 2023-01-14 DIAGNOSIS — D472 Monoclonal gammopathy: Secondary | ICD-10-CM

## 2023-01-14 DIAGNOSIS — L4050 Arthropathic psoriasis, unspecified: Secondary | ICD-10-CM | POA: Diagnosis not present

## 2023-01-14 DIAGNOSIS — Z79899 Other long term (current) drug therapy: Secondary | ICD-10-CM | POA: Insufficient documentation

## 2023-01-14 LAB — COMPREHENSIVE METABOLIC PANEL
ALT: 12 U/L (ref 0–44)
AST: 17 U/L (ref 15–41)
Albumin: 3.9 g/dL (ref 3.5–5.0)
Alkaline Phosphatase: 66 U/L (ref 38–126)
Anion gap: 8 (ref 5–15)
BUN: 18 mg/dL (ref 6–20)
CO2: 27 mmol/L (ref 22–32)
Calcium: 8.9 mg/dL (ref 8.9–10.3)
Chloride: 104 mmol/L (ref 98–111)
Creatinine, Ser: 0.69 mg/dL (ref 0.44–1.00)
GFR, Estimated: >60 mL/min (ref >60)
Glucose, Bld: 93 mg/dL (ref 70–99)
Potassium: 3.7 mmol/L (ref 3.5–5.1)
Sodium: 139 mmol/L (ref 135–145)
Total Bilirubin: 0.2 mg/dL — ABNORMAL LOW (ref 0.3–1.2)
Total Protein: 7.5 g/dL (ref 6.5–8.1)

## 2023-01-14 LAB — CBC WITH DIFFERENTIAL/PLATELET
Abs Immature Granulocytes: 0.01 10*3/uL (ref 0.00–0.07)
Basophils Absolute: 0 10*3/uL (ref 0.0–0.1)
Basophils Relative: 1 %
Eosinophils Absolute: 0.1 10*3/uL (ref 0.0–0.5)
Eosinophils Relative: 2 %
HCT: 31.6 % — ABNORMAL LOW (ref 36.0–46.0)
Hemoglobin: 10.5 g/dL — ABNORMAL LOW (ref 12.0–15.0)
Immature Granulocytes: 0 %
Lymphocytes Relative: 39 %
Lymphs Abs: 1.3 10*3/uL (ref 0.7–4.0)
MCH: 34 pg (ref 26.0–34.0)
MCHC: 33.2 g/dL (ref 30.0–36.0)
MCV: 102.3 fL — ABNORMAL HIGH (ref 80.0–100.0)
Monocytes Absolute: 0.4 10*3/uL (ref 0.1–1.0)
Monocytes Relative: 13 %
Neutro Abs: 1.6 10*3/uL — ABNORMAL LOW (ref 1.7–7.7)
Neutrophils Relative %: 45 %
Platelets: 133 10*3/uL — ABNORMAL LOW (ref 150–400)
RBC: 3.09 MIL/uL — ABNORMAL LOW (ref 3.87–5.11)
RDW: 13.3 % (ref 11.5–15.5)
WBC: 3.4 10*3/uL — ABNORMAL LOW (ref 4.0–10.5)
nRBC: 0 % (ref 0.0–0.2)

## 2023-01-15 LAB — KAPPA/LAMBDA LIGHT CHAINS
Kappa free light chain: 14.3 mg/L (ref 3.3–19.4)
Kappa, lambda light chain ratio: 0.12 — ABNORMAL LOW (ref 0.26–1.65)
Lambda free light chains: 124.2 mg/L — ABNORMAL HIGH (ref 5.7–26.3)

## 2023-01-18 NOTE — Progress Notes (Signed)
Hematology/Oncology Consult note [facility]  [phone] [phone]  Patient Care Team: Margaretann Loveless, MD as PCP - General (Internal Medicine)   Name of the patient: Cassandra Carr  [id number]  October 29, 1965   Date of visit: 01/18/23  Diagnosis- IgG lambda smoldering multiple myeloma  Chief complaint/ Reason for visit- routine f/u of smoldering multiple myeloma  Heme/Onc history: Patient is a 57 year old female with a history of Psoriatic arthritis currently on methotrexate. 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 At baseline patient has chronic macrocytic anemia with a hemoglobin that has remained stable around 11 at least for the last 3 to 4 years. White cell count and platelets are normal. BMP was within normal limits. She had an SPEP checked which showed an elevated IgG of 1987 with an M spike of 1 g IgG lambda. Further results of blood work done for myeloma workup showed an elevated lambda light chain of 144 with a lambda kappa ratio of 8.  Anemia workup was otherwise unremarkable except for a low B12 level of 181.  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    Interval history- Patient is doing well. She continues to work and denies any new complaints. Denies significant fatigue. No recent illnesses  ECOG PS- 1 Pain scale- 0   Review of systems- Review of Systems  Constitutional:  Negative for chills, fever, malaise/fatigue and weight loss.  HENT:  Negative for congestion, ear discharge and nosebleeds.   Eyes:  Negative for blurred vision.  Respiratory:  Negative for cough, hemoptysis, sputum production, shortness of breath and wheezing.   Cardiovascular:  Negative for chest pain, palpitations, orthopnea and claudication.  Gastrointestinal:  Negative for abdominal pain, blood in stool, constipation, diarrhea, heartburn, melena, nausea and vomiting.  Genitourinary:  Negative for dysuria,  flank pain, frequency, hematuria and urgency.  Musculoskeletal:  Negative for back pain, joint pain and myalgias.  Skin:  Negative for rash.  Neurological:  Negative for dizziness, tingling, focal weakness, seizures, weakness and headaches.  Endo/Heme/Allergies:  Does not bruise/bleed easily.  Psychiatric/Behavioral:  Negative for depression and suicidal ideas. The patient does not have insomnia.       No Known Allergies   Past Medical History:  Diagnosis Date   Anemia    Anxiety    Asthma    Headache    History of kidney stones    Kidney stone    Psoriatic arthritis (HCC)    Rheumatoid arthritis (HCC)    Smoldering multiple myeloma      Past Surgical History:  Procedure Laterality Date   ABDOMINAL HYSTERECTOMY     BREAST BIOPSY Left    negative 06/2012   CHOLECYSTECTOMY     CYSTOSCOPY/URETEROSCOPY/HOLMIUM LASER/STENT PLACEMENT Right 02/06/2020   Procedure: CYSTOSCOPY/URETEROSCOPY/HOLMIUM LASER/STENT PLACEMENT;  Surgeon: Sondra Come, MD;  Location: [facility] OR[facility];  Service: Urology;  Laterality: Right;    Social History   Socioeconomic History   Marital status: Married    Spouse name: Not on file   Number of children: Not on file   Years of education: Not on file   Highest education level: Not on file  Occupational History   Not on file  Tobacco Use   Smoking status: Never   Smokeless tobacco: Never  Vaping Use   Vaping status: Never Used  Substance and Sexual Activity   Alcohol use: Never    Comment: very rare.   Drug use: Never   Sexual activity:  Yes    Birth control/protection: None  Other Topics Concern   Not on file  Social History Narrative   Not on file   Social Determinants of Health   Financial Resource Strain: Not on file  Food Insecurity: Not on file  Transportation Needs: Not on file  Physical Activity: Not on file  Stress: Not on file  Social Connections: Not on file  Intimate Partner Violence: Not on file    Family History  Problem  Relation Age of Onset   Lung cancer Mother    Esophageal cancer Father    Heart attack Paternal Grandfather    Breast cancer Neg Hx      Current Outpatient Medications:    alendronate (FOSAMAX) 70 MG tablet, Take 1 tablet (70 mg total) by mouth once a week., Disp: 12 tablet, Rfl: 3   ALPRAZolam (XANAX) 0.25 MG tablet, Take 1 tablet (0.25 mg total) by mouth at bedtime as needed for anxiety., Disp: 30 tablet, Rfl: 0   budesonide (PULMICORT) 180 MCG/ACT inhaler, Inhale 1 puff into the lungs 2 (two) times daily., Disp: 1 each, Rfl: 2   celecoxib (CELEBREX) 200 MG capsule, Take 1 capsule (200 mg total) by mouth daily., Disp: 90 capsule, Rfl: 3   cloNIDine (CATAPRES) 0.3 MG tablet, Take 1 tablet (0.3 mg total) by mouth daily., Disp: 90 tablet, Rfl: 3   COSENTYX SENSOREADY, 300 MG, 150 MG/ML SOAJ, Inject 2 Syringes into the skin every 28 (twenty-eight) days., Disp: , Rfl:    fluticasone (FLONASE) 50 MCG/ACT nasal spray, Place 1 spray into both nostrils daily., Disp: 16 g, Rfl: 6   folic acid (FOLVITE) 1 MG tablet, Take 1 mg by mouth daily., Disp: , Rfl:    gabapentin (NEURONTIN) 100 MG capsule, Take 1 capsule (100 mg total) by mouth daily., Disp: 90 capsule, Rfl: 3   methotrexate 2.5 MG tablet, SMARTSIG:5 Tablet(s) By Mouth Once a Week, Disp: , Rfl:    sertraline (ZOLOFT) 100 MG tablet, Take 1 tablet (100 mg total) by mouth daily., Disp: 90 tablet, Rfl: 3   SUMAtriptan (IMITREX) 50 MG tablet, Take 1 tablet (50 mg total) by mouth as needed for migraine (take one may repeat in 6 hours if needed)., Disp: 10 tablet, Rfl: 3   tiZANidine (ZANAFLEX) 4 MG tablet, Take 1 tablet (4 mg total) by mouth daily. AM, Disp: 30 tablet, Rfl: 6   Vitamin D, Ergocalciferol, 50000 units CAPS, once a week, Disp: 12 capsule, Rfl: 0  Physical exam:  Vitals:   01/14/23 1458  BP: (!) 115/58  Pulse: 71  Temp: (!) 96.8 F (36 C)  TempSrc: Tympanic  SpO2: 99%  Weight: 116 lb 6.4 oz (52.8 kg)  Height: 5\' 1"  (1.549 m)    Physical Exam Cardiovascular:     Rate and Rhythm: Normal rate and regular rhythm.     Heart sounds: Normal heart sounds.  Pulmonary:     Effort: Pulmonary effort is normal.     Breath sounds: Normal breath sounds.  Skin:    General: Skin is warm and dry.  Neurological:     Mental Status: She is alert and oriented to person, place, and time.         Latest Ref Rng & Units 01/14/2023    3:07 PM  CMP  Glucose 70 - 99 mg/dL 93   BUN 6 - 20 mg/dL 18   Creatinine 2.53 - 1.00 mg/dL 6.64   Sodium 403 - 474 mmol/L 139   Potassium 3.5 -  5.1 mmol/L 3.7   Chloride 98 - 111 mmol/L 104   CO2 22 - 32 mmol/L 27   Calcium 8.9 - 10.3 mg/dL 8.9   Total Protein 6.5 - 8.1 g/dL 7.5   Total Bilirubin 0.3 - 1.2 mg/dL 0.2   Alkaline Phos 38 - 126 U/L 66   AST 15 - 41 U/L 17   ALT 0 - 44 U/L 12       Latest Ref Rng & Units 01/14/2023    3:07 PM  CBC  WBC 4.0 - 10.5 K/uL 3.4   Hemoglobin 12.0 - 15.0 g/dL 16.1   Hematocrit 09.6 - 46.0 % 31.6   Platelets 150 - 400 K/uL 133      Assessment and plan- Patient is a 57 y.o. female with h/o low risk IgG lambda smoldering multiple myeloma here for routine f/u  Patient's hemoglobin presently is low at 10.5 as compared to 6 months ago when she was 12.4.  Myeloma panel is still pending.  Serum free lambda light chain has remained stable between 120s to 150s with a ratio of 0.12 which has not changed as compared to what it was 7 months ago.  Based on her myeloma labs alone it is difficult to explain her anemia.  She has had a anemia workup in April 2024 including iron studies and B12 levels which have been normal in the past.  I will plan to repeat CBC with differential CMP myeloma panel ferritin and iron studies and B12 levels in 3 months and I will see her back in 6 months with labs.   Visit Diagnosis 1. Smoldering multiple myeloma      Dr. Owens Shark, MD, MPH [facility] at [facility] [phone] 01/18/2023 6:25 PM

## 2023-01-19 LAB — MULTIPLE MYELOMA PANEL, SERUM
Albumin SerPl Elph-Mcnc: 3.5 g/dL (ref 2.9–4.4)
Albumin/Glob SerPl: 1.2 (ref 0.7–1.7)
Alpha 1: 0.2 g/dL (ref 0.0–0.4)
Alpha2 Glob SerPl Elph-Mcnc: 0.6 g/dL (ref 0.4–1.0)
B-Globulin SerPl Elph-Mcnc: 0.8 g/dL (ref 0.7–1.3)
Gamma Glob SerPl Elph-Mcnc: 1.5 g/dL (ref 0.4–1.8)
Globulin, Total: 3.1 g/dL (ref 2.2–3.9)
IgA: 120 mg/dL (ref 87–352)
IgG (Immunoglobin G), Serum: 1614 mg/dL — ABNORMAL HIGH (ref 586–1602)
IgM (Immunoglobulin M), Srm: 42 mg/dL (ref 26–217)
M Protein SerPl Elph-Mcnc: 0.9 g/dL — ABNORMAL HIGH
Total Protein ELP: 6.6 g/dL (ref 6.0–8.5)

## 2023-01-21 ENCOUNTER — Other Ambulatory Visit: Payer: Self-pay | Admitting: Family

## 2023-01-21 ENCOUNTER — Ambulatory Visit (INDEPENDENT_AMBULATORY_CARE_PROVIDER_SITE_OTHER): Payer: BC Managed Care – PPO | Admitting: Family

## 2023-01-21 DIAGNOSIS — R350 Frequency of micturition: Secondary | ICD-10-CM | POA: Diagnosis not present

## 2023-01-21 LAB — POCT URINALYSIS DIPSTICK
Bilirubin, UA: NEGATIVE
Blood, UA: NEGATIVE
Glucose, UA: NEGATIVE
Ketones, UA: NEGATIVE
Leukocytes, UA: NEGATIVE
Nitrite, UA: NEGATIVE
Protein, UA: NEGATIVE
Spec Grav, UA: <=1.005 — AB (ref 1.010–1.025)
Urobilinogen, UA: 0.2 E.U./dL
pH, UA: 6 (ref 5.0–8.0)

## 2023-01-21 NOTE — Progress Notes (Signed)
   Subjective   CHIEF COMPLAINT  Possible UTI     REASON FOR VISIT  UA only       Objective   Results for orders placed or performed in visit on 01/21/23  POCT Urinalysis Dipstick (81002)  Result Value Ref Range   Color, UA light yellow    Clarity, UA clear    Glucose, UA Negative Negative   Bilirubin, UA negative    Ketones, UA negative    Spec Grav, UA <=1.005 (A) 1.010 - 1.025   Blood, UA negative    pH, UA 6.0 5.0 - 8.0   Protein, UA Negative Negative   Urobilinogen, UA 0.2 0.2 or 1.0 E.U./dL   Nitrite, UA negative    Leukocytes, UA Negative Negative   Appearance     Odor none     Assessment & Plan  1. Frequent urination UA in office today shows only abnormal spec gravitiy.  Sending for UA/UC  Will send abx as appropriate.   - POCT Urinalysis Dipstick (81002) - Urinalysis, Routine w reflex microscopic - Urine Culture   Total time spent: 10 minutes  Miki Kins, FNP 01/21/2023

## 2023-01-22 ENCOUNTER — Encounter: Payer: Self-pay | Admitting: Oncology

## 2023-01-22 LAB — URINALYSIS, ROUTINE W REFLEX MICROSCOPIC
Bilirubin, UA: NEGATIVE
Glucose, UA: NEGATIVE
Ketones, UA: NEGATIVE
Leukocytes,UA: NEGATIVE
Nitrite, UA: NEGATIVE
Protein,UA: NEGATIVE
RBC, UA: NEGATIVE
Specific Gravity, UA: 1.005 (ref 1.005–1.030)
Urobilinogen, Ur: 0.2 mg/dL (ref 0.2–1.0)
pH, UA: 6.5 (ref 5.0–7.5)

## 2023-01-22 NOTE — Progress Notes (Signed)
Spoke with pt who verbalized understanding.

## 2023-01-23 LAB — URINE CULTURE: Organism ID, Bacteria: NO GROWTH

## 2023-02-08 ENCOUNTER — Other Ambulatory Visit: Payer: Self-pay | Admitting: Internal Medicine

## 2023-02-08 DIAGNOSIS — F411 Generalized anxiety disorder: Secondary | ICD-10-CM

## 2023-02-20 ENCOUNTER — Other Ambulatory Visit: Payer: Self-pay | Admitting: Internal Medicine

## 2023-02-20 DIAGNOSIS — L4050 Arthropathic psoriasis, unspecified: Secondary | ICD-10-CM

## 2023-02-20 NOTE — Telephone Encounter (Signed)
Spoke with pharmacy and have corrected provider for Rx.

## 2023-03-06 ENCOUNTER — Telehealth: Payer: Self-pay

## 2023-03-06 NOTE — Telephone Encounter (Signed)
Dr.DEFOOR,WILLIAM from [facility] left message for Dr. Smith Robert to notify her that he is increasing the dose of her immunosuppressant (Cosentyx ). Notification only, no need for call back per MD

## 2023-03-11 ENCOUNTER — Other Ambulatory Visit: Payer: Self-pay | Admitting: Internal Medicine

## 2023-03-11 DIAGNOSIS — F411 Generalized anxiety disorder: Secondary | ICD-10-CM

## 2023-03-14 ENCOUNTER — Other Ambulatory Visit: Payer: Self-pay | Admitting: Family

## 2023-03-16 ENCOUNTER — Other Ambulatory Visit: Payer: Self-pay | Admitting: Internal Medicine

## 2023-03-16 DIAGNOSIS — F411 Generalized anxiety disorder: Secondary | ICD-10-CM

## 2023-04-07 ENCOUNTER — Other Ambulatory Visit: Payer: Self-pay | Admitting: Internal Medicine

## 2023-04-07 DIAGNOSIS — F411 Generalized anxiety disorder: Secondary | ICD-10-CM

## 2023-04-16 ENCOUNTER — Inpatient Hospital Stay: Payer: BC Managed Care – PPO | Attending: Oncology

## 2023-04-16 ENCOUNTER — Other Ambulatory Visit: Payer: BC Managed Care – PPO

## 2023-04-16 DIAGNOSIS — D472 Monoclonal gammopathy: Secondary | ICD-10-CM | POA: Insufficient documentation

## 2023-04-16 LAB — CBC WITH DIFFERENTIAL (CANCER CENTER ONLY)
Abs Immature Granulocytes: 0.01 10*3/uL (ref 0.00–0.07)
Basophils Absolute: 0 10*3/uL (ref 0.0–0.1)
Basophils Relative: 1 %
Eosinophils Absolute: 0.1 10*3/uL (ref 0.0–0.5)
Eosinophils Relative: 4 %
HCT: 33.3 % — ABNORMAL LOW (ref 36.0–46.0)
Hemoglobin: 10.8 g/dL — ABNORMAL LOW (ref 12.0–15.0)
Immature Granulocytes: 0 %
Lymphocytes Relative: 38 %
Lymphs Abs: 1.4 10*3/uL (ref 0.7–4.0)
MCH: 33.8 pg (ref 26.0–34.0)
MCHC: 32.4 g/dL (ref 30.0–36.0)
MCV: 104.1 fL — ABNORMAL HIGH (ref 80.0–100.0)
Monocytes Absolute: 0.3 10*3/uL (ref 0.1–1.0)
Monocytes Relative: 9 %
Neutro Abs: 1.8 10*3/uL (ref 1.7–7.7)
Neutrophils Relative %: 48 %
Platelet Count: 123 10*3/uL — ABNORMAL LOW (ref 150–400)
RBC: 3.2 MIL/uL — ABNORMAL LOW (ref 3.87–5.11)
RDW: 13.2 % (ref 11.5–15.5)
WBC Count: 3.7 10*3/uL — ABNORMAL LOW (ref 4.0–10.5)
nRBC: 0 % (ref 0.0–0.2)

## 2023-04-16 LAB — IRON AND TIBC
Iron: 100 ug/dL (ref 28–170)
Saturation Ratios: 26 % (ref 10.4–31.8)
TIBC: 392 ug/dL (ref 250–450)
UIBC: 292 ug/dL

## 2023-04-16 LAB — CMP (CANCER CENTER ONLY)
ALT: 20 U/L (ref 0–44)
AST: 22 U/L (ref 15–41)
Albumin: 4.2 g/dL (ref 3.5–5.0)
Alkaline Phosphatase: 84 U/L (ref 38–126)
Anion gap: 6 (ref 5–15)
BUN: 21 mg/dL — ABNORMAL HIGH (ref 6–20)
CO2: 26 mmol/L (ref 22–32)
Calcium: 8.8 mg/dL — ABNORMAL LOW (ref 8.9–10.3)
Chloride: 105 mmol/L (ref 98–111)
Creatinine: 0.77 mg/dL (ref 0.44–1.00)
GFR, Estimated: >60 mL/min (ref >60)
Glucose, Bld: 98 mg/dL (ref 70–99)
Potassium: 4.1 mmol/L (ref 3.5–5.1)
Sodium: 137 mmol/L (ref 135–145)
Total Bilirubin: 0.5 mg/dL (ref 0.3–1.2)
Total Protein: 7.9 g/dL (ref 6.5–8.1)

## 2023-04-16 LAB — VITAMIN B12: Vitamin B-12: 352 pg/mL (ref 180–914)

## 2023-04-16 LAB — FERRITIN: Ferritin: 60 ng/mL (ref 11–307)

## 2023-04-19 LAB — KAPPA/LAMBDA LIGHT CHAINS
Kappa free light chain: 14.5 mg/L (ref 3.3–19.4)
Kappa, lambda light chain ratio: 0.1 — ABNORMAL LOW (ref 0.26–1.65)
Lambda free light chains: 150.9 mg/L — ABNORMAL HIGH (ref 5.7–26.3)

## 2023-04-21 LAB — MULTIPLE MYELOMA PANEL, SERUM
Albumin SerPl Elph-Mcnc: 3.6 g/dL (ref 2.9–4.4)
Albumin/Glob SerPl: 1.1 (ref 0.7–1.7)
Alpha 1: 0.2 g/dL (ref 0.0–0.4)
Alpha2 Glob SerPl Elph-Mcnc: 0.6 g/dL (ref 0.4–1.0)
B-Globulin SerPl Elph-Mcnc: 0.9 g/dL (ref 0.7–1.3)
Gamma Glob SerPl Elph-Mcnc: 1.6 g/dL (ref 0.4–1.8)
Globulin, Total: 3.4 g/dL (ref 2.2–3.9)
IgA: 133 mg/dL (ref 87–352)
IgG (Immunoglobin G), Serum: 1788 mg/dL — ABNORMAL HIGH (ref 586–1602)
IgM (Immunoglobulin M), Srm: 41 mg/dL (ref 26–217)
M Protein SerPl Elph-Mcnc: 1.2 g/dL — ABNORMAL HIGH
Total Protein ELP: 7 g/dL (ref 6.0–8.5)

## 2023-04-23 ENCOUNTER — Encounter: Payer: Self-pay | Admitting: Oncology

## 2023-04-27 ENCOUNTER — Other Ambulatory Visit: Payer: Self-pay | Admitting: Internal Medicine

## 2023-04-27 DIAGNOSIS — F411 Generalized anxiety disorder: Secondary | ICD-10-CM

## 2023-05-18 ENCOUNTER — Encounter: Payer: Self-pay | Admitting: Internal Medicine

## 2023-05-18 ENCOUNTER — Ambulatory Visit (INDEPENDENT_AMBULATORY_CARE_PROVIDER_SITE_OTHER): Payer: BC Managed Care – PPO | Admitting: Internal Medicine

## 2023-05-18 VITALS — BP 130/80 | HR 72 | Ht 61.5 in | Wt 122.6 lb

## 2023-05-18 DIAGNOSIS — F411 Generalized anxiety disorder: Secondary | ICD-10-CM

## 2023-05-18 DIAGNOSIS — L4050 Arthropathic psoriasis, unspecified: Secondary | ICD-10-CM

## 2023-05-18 DIAGNOSIS — E782 Mixed hyperlipidemia: Secondary | ICD-10-CM | POA: Diagnosis not present

## 2023-05-18 DIAGNOSIS — D472 Monoclonal gammopathy: Secondary | ICD-10-CM

## 2023-05-18 DIAGNOSIS — Z0130 Encounter for examination of blood pressure without abnormal findings: Secondary | ICD-10-CM

## 2023-05-18 DIAGNOSIS — J301 Allergic rhinitis due to pollen: Secondary | ICD-10-CM | POA: Diagnosis not present

## 2023-05-18 NOTE — Progress Notes (Signed)
Established Patient Office Visit  Subjective:  Patient ID: Cassandra Carr, female    DOB: 1966/03/20  Age: 57 y.o. MRN: [id number]  Chief Complaint  Patient presents with   Follow-up    6 month follow up    Patient is here for her follow-up today.  She is generally feeling well, has gained a few pounds, and her blood pressure is a little higher than before.  Patient admits that she will start watching her diet and increase her physical activity.  Most of her labs were done recently however we need to check a lipid panel today. She is taking all her medications as prescribed.    No other concerns at this time.   Past Medical History:  Diagnosis Date   Anemia    Anxiety    Asthma    Headache    History of kidney stones    Kidney stone    Psoriatic arthritis (HCC)    Rheumatoid arthritis (HCC)    Smoldering multiple myeloma     Past Surgical History:  Procedure Laterality Date   ABDOMINAL HYSTERECTOMY     BREAST BIOPSY Left    negative 06/2012   CHOLECYSTECTOMY     CYSTOSCOPY/URETEROSCOPY/HOLMIUM LASER/STENT PLACEMENT Right 02/06/2020   Procedure: CYSTOSCOPY/URETEROSCOPY/HOLMIUM LASER/STENT PLACEMENT;  Surgeon: Sondra Come, MD;  Location: [facility] ORS;  Service: Urology;  Laterality: Right;    Social History   Socioeconomic History   Marital status: Married    Spouse name: Not on file   Number of children: Not on file   Years of education: Not on file   Highest education level: Not on file  Occupational History   Not on file  Tobacco Use   Smoking status: Never   Smokeless tobacco: Never  Vaping Use   Vaping status: Never Used  Substance and Sexual Activity   Alcohol use: Never    Comment: very rare.   Drug use: Never   Sexual activity: Yes    Birth control/protection: None  Other Topics Concern   Not on file  Social History Narrative   Not on file   Social Determinants of Health   Financial Resource Strain: Not on file  Food Insecurity: Not on  file  Transportation Needs: Not on file  Physical Activity: Not on file  Stress: Not on file  Social Connections: Not on file  Intimate Partner Violence: Not on file    Family History  Problem Relation Age of Onset   Lung cancer Mother    Esophageal cancer Father    Heart attack Paternal Grandfather    Breast cancer Neg Hx     No Known Allergies  Review of Systems  Constitutional: Negative.  Negative for chills, fever and malaise/fatigue.  HENT: Negative.    Eyes: Negative.   Respiratory: Negative.  Negative for cough and shortness of breath.   Cardiovascular: Negative.  Negative for chest pain, palpitations and leg swelling.  Gastrointestinal: Negative.  Negative for abdominal pain, constipation, diarrhea, heartburn, nausea and vomiting.  Genitourinary: Negative.  Negative for dysuria and flank pain.  Musculoskeletal: Negative.  Negative for joint pain and myalgias.  Skin: Negative.   Neurological: Negative.  Negative for dizziness, tingling, tremors and headaches.  Endo/Heme/Allergies: Negative.   Psychiatric/Behavioral: Negative.  Negative for depression and suicidal ideas. The patient is not nervous/anxious.        Objective:   BP 130/80   Pulse 72   Ht 5' 1.5" (1.562 m)   Wt 122 lb 9.6  oz (55.6 kg)   SpO2 99%   BMI 22.79 kg/m   Vitals:   05/18/23 0859  BP: 130/80  Pulse: 72  Height: 5' 1.5" (1.562 m)  Weight: 122 lb 9.6 oz (55.6 kg)  SpO2: 99%  BMI (Calculated): 22.79    Physical Exam Vitals and nursing note reviewed.  Constitutional:      Appearance: Normal appearance.  HENT:     Head: Normocephalic and atraumatic.     Nose: Nose normal.     Mouth/Throat:     Mouth: Mucous membranes are moist.     Pharynx: Oropharynx is clear.  Eyes:     Conjunctiva/sclera: Conjunctivae normal.     Pupils: Pupils are equal, round, and reactive to light.  Cardiovascular:     Rate and Rhythm: Normal rate and regular rhythm.     Pulses: Normal pulses.      Heart sounds: Normal heart sounds. No murmur heard. Pulmonary:     Effort: Pulmonary effort is normal.     Breath sounds: Normal breath sounds. No wheezing.  Abdominal:     General: Bowel sounds are normal.     Palpations: Abdomen is soft.     Tenderness: There is no abdominal tenderness. There is no right CVA tenderness or left CVA tenderness.  Musculoskeletal:        General: Normal range of motion.     Cervical back: Normal range of motion.     Right lower leg: No edema.     Left lower leg: No edema.  Skin:    General: Skin is warm and dry.  Neurological:     General: No focal deficit present.     Mental Status: She is alert and oriented to person, place, and time.  Psychiatric:        Mood and Affect: Mood normal.        Behavior: Behavior normal.      No results found for any visits on 05/18/23.  Recent Results (from the past 2160 hour(s))  Vitamin B12     Status: None   Collection Time: 04/16/23  7:55 AM  Result Value Ref Range   Vitamin B-12 352 180 - 914 pg/mL    Comment: (NOTE) This assay is not validated for testing neonatal or myeloproliferative syndrome specimens for Vitamin B12 levels. Performed at [facility], [address], [place], [place] [address]   Iron and TIBC     Status: None   Collection Time: 04/16/23  7:55 AM  Result Value Ref Range   Iron 100 28 - 170 ug/dL   TIBC 604 540 - 981 ug/dL   Saturation Ratios 26 10.4 - 31.8 %   UIBC 292 ug/dL    Comment: Performed at [facility], [address] [place], [place] [address]  Ferritin     Status: None   Collection Time: 04/16/23  7:55 AM  Result Value Ref Range   Ferritin 60 11 - 307 ng/mL    Comment: Performed at [facility], [address] [place], [place] [address]  Kappa/lambda light chains     Status: Abnormal   Collection Time: 04/16/23  7:55 AM  Result Value Ref Range   Kappa free light chain 14.5 3.3 - 19.4 mg/L   Lambda free light chains 150.9 (H) 5.7 -  26.3 mg/L   Kappa, lambda light chain ratio 0.10 (L) 0.26 - 1.65    Comment: (NOTE) Performed At: [facility] [facility] [address] [place], [place]  [id number] Jolene Schimke MD IO:[id number]   Multiple Myeloma Panel (SPEP&IFE w/QIG)     Status: Abnormal   Collection Time: 04/16/23  7:55 AM  Result Value Ref Range   IgG (Immunoglobin G), Serum 1,788 (H) 586 - 1,602 mg/dL   IgA 244 87 - 010 mg/dL   IgM (Immunoglobulin M), Srm 41 26 - 217 mg/dL   Total Protein ELP 7.0 6.0 - 8.5 g/dL   Albumin SerPl Elph-Mcnc 3.6 2.9 - 4.4 g/dL   Alpha 1 0.2 0.0 - 0.4 g/dL   Alpha2 Glob SerPl Elph-Mcnc 0.6 0.4 - 1.0 g/dL   B-Globulin SerPl Elph-Mcnc 0.9 0.7 - 1.3 g/dL   Gamma Glob SerPl Elph-Mcnc 1.6 0.4 - 1.8 g/dL   M Protein SerPl Elph-Mcnc 1.2 (H) Not Observed g/dL   Globulin, Total 3.4 2.2 - 3.9 g/dL   Albumin/Glob SerPl 1.1 0.7 - 1.7   IFE 1 Comment (A)     Comment: (NOTE) Immunofixation shows IgG monoclonal protein with lambda light chain specificity.    Please Note Comment     Comment: (NOTE) Protein electrophoresis scan will follow via computer, mail, or courier delivery. Performed At: [facility] [address] [place], [place] [id number] Jolene Schimke MD IH:[id number]   CMP (Cancer Center only)     Status: Abnormal   Collection Time: 04/16/23  7:55 AM  Result Value Ref Range   Sodium 137 135 - 145 mmol/L   Potassium 4.1 3.5 - 5.1 mmol/L   Chloride 105 98 - 111 mmol/L   CO2 26 22 - 32 mmol/L   Glucose, Bld 98 70 - 99 mg/dL    Comment: Glucose reference range applies only to samples taken after fasting for at least 8 hours.   BUN 21 (H) 6 - 20 mg/dL   Creatinine 7.56 4.33 - 1.00 mg/dL   Calcium 8.8 (L) 8.9 - 10.3 mg/dL   Total Protein 7.9 6.5 - 8.1 g/dL   Albumin 4.2 3.5 - 5.0 g/dL   AST 22 15 - 41 U/L   ALT 20 0 - 44 U/L   Alkaline Phosphatase 84 38 - 126 U/L   Total Bilirubin 0.5 0.3 - 1.2 mg/dL   GFR, Estimated >29 >51 mL/min    Comment: (NOTE) Calculated  using the CKD-EPI Creatinine Equation (2021)    Anion gap 6 5 - 15    Comment: Performed at [facility], [address] [place], [place] [address]  CBC with Differential (Cancer Center Only)     Status: Abnormal   Collection Time: 04/16/23  7:55 AM  Result Value Ref Range   WBC Count 3.7 (L) 4.0 - 10.5 K/uL   RBC 3.20 (L) 3.87 - 5.11 MIL/uL   Hemoglobin 10.8 (L) 12.0 - 15.0 g/dL   HCT 60.6 (L) 30.1 - 60.1 %   MCV 104.1 (H) 80.0 - 100.0 fL   MCH 33.8 26.0 - 34.0 pg   MCHC 32.4 30.0 - 36.0 g/dL   RDW 09.3 23.5 - 57.3 %   Platelet Count 123 (L) 150 - 400 K/uL   nRBC 0.0 0.0 - 0.2 %   Neutrophils Relative % 48 %   Neutro Abs 1.8 1.7 - 7.7 K/uL   Lymphocytes Relative 38 %   Lymphs Abs 1.4 0.7 - 4.0 K/uL   Monocytes Relative 9 %   Monocytes Absolute 0.3 0.1 - 1.0 K/uL   Eosinophils Relative 4 %   Eosinophils Absolute 0.1 0.0 - 0.5 K/uL   Basophils Relative 1 %  Basophils Absolute 0.0 0.0 - 0.1 K/uL   Immature Granulocytes 0 %   Abs Immature Granulocytes 0.01 0.00 - 0.07 K/uL    Comment: Performed at [facility], [address] [place], [place] [address]      Assessment & Plan:  Patient will start a strict diet program.  Monitor her weight and blood pressure.  Continue all medications.  Check lipid panel today. Problem List Items Addressed This Visit     Psoriatic arthritis (HCC)   GAD (generalized anxiety disorder)   Mixed hyperlipidemia   Non-seasonal allergic rhinitis due to pollen   Other Visit Diagnoses     MGUS (monoclonal gammopathy of unknown significance)    -  Primary   Relevant Orders   Lipid Panel w/o Chol/HDL Ratio       Return in about 4 months (around 09/15/2023).   Total time spent: 30 minutes  Margaretann Loveless, MD  05/18/2023   This document may have been prepared by [facility] Voice Recognition software and as such may include unintentional dictation errors.

## 2023-05-19 LAB — LIPID PANEL W/O CHOL/HDL RATIO
Cholesterol, Total: 189 mg/dL (ref 100–199)
HDL: 64 mg/dL (ref >39)
LDL Chol Calc (NIH): 111 mg/dL — ABNORMAL HIGH (ref 0–99)
Triglycerides: 76 mg/dL (ref 0–149)
VLDL Cholesterol Cal: 14 mg/dL (ref 5–40)

## 2023-05-24 ENCOUNTER — Other Ambulatory Visit: Payer: Self-pay | Admitting: Internal Medicine

## 2023-05-24 DIAGNOSIS — F411 Generalized anxiety disorder: Secondary | ICD-10-CM

## 2023-05-25 NOTE — Progress Notes (Signed)
Patient notified

## 2023-06-13 ENCOUNTER — Other Ambulatory Visit: Payer: Self-pay | Admitting: Family

## 2023-06-24 ENCOUNTER — Other Ambulatory Visit: Payer: Self-pay | Admitting: Internal Medicine

## 2023-06-24 DIAGNOSIS — F411 Generalized anxiety disorder: Secondary | ICD-10-CM

## 2023-07-17 ENCOUNTER — Ambulatory Visit: Payer: BC Managed Care – PPO | Admitting: Oncology

## 2023-07-17 ENCOUNTER — Other Ambulatory Visit: Payer: BC Managed Care – PPO

## 2023-07-17 ENCOUNTER — Inpatient Hospital Stay: Payer: BC Managed Care – PPO | Attending: Oncology

## 2023-07-17 ENCOUNTER — Inpatient Hospital Stay (HOSPITAL_BASED_OUTPATIENT_CLINIC_OR_DEPARTMENT_OTHER): Payer: BC Managed Care – PPO | Admitting: Oncology

## 2023-07-17 VITALS — BP 162/82 | HR 65 | Temp 96.4°F | Resp 18 | Wt 123.0 lb

## 2023-07-17 DIAGNOSIS — D472 Monoclonal gammopathy: Secondary | ICD-10-CM | POA: Insufficient documentation

## 2023-07-17 LAB — CMP (CANCER CENTER ONLY)
ALT: 24 U/L (ref 0–44)
AST: 26 U/L (ref 15–41)
Albumin: 4.8 g/dL (ref 3.5–5.0)
Alkaline Phosphatase: 59 U/L (ref 38–126)
Anion gap: 9 (ref 5–15)
BUN: 14 mg/dL (ref 6–20)
CO2: 26 mmol/L (ref 22–32)
Calcium: 9.5 mg/dL (ref 8.9–10.3)
Chloride: 101 mmol/L (ref 98–111)
Creatinine: 0.69 mg/dL (ref 0.44–1.00)
GFR, Estimated: >60 mL/min (ref >60)
Glucose, Bld: 83 mg/dL (ref 70–99)
Potassium: 3.8 mmol/L (ref 3.5–5.1)
Sodium: 136 mmol/L (ref 135–145)
Total Bilirubin: 0.7 mg/dL (ref 0.0–1.2)
Total Protein: 8.7 g/dL — ABNORMAL HIGH (ref 6.5–8.1)

## 2023-07-17 LAB — CBC WITH DIFFERENTIAL (CANCER CENTER ONLY)
Abs Immature Granulocytes: 0.03 10*3/uL (ref 0.00–0.07)
Basophils Absolute: 0 10*3/uL (ref 0.0–0.1)
Basophils Relative: 0 %
Eosinophils Absolute: 0 10*3/uL (ref 0.0–0.5)
Eosinophils Relative: 0 %
HCT: 34.1 % — ABNORMAL LOW (ref 36.0–46.0)
Hemoglobin: 11.4 g/dL — ABNORMAL LOW (ref 12.0–15.0)
Immature Granulocytes: 1 %
Lymphocytes Relative: 38 %
Lymphs Abs: 1.9 10*3/uL (ref 0.7–4.0)
MCH: 34 pg (ref 26.0–34.0)
MCHC: 33.4 g/dL (ref 30.0–36.0)
MCV: 101.8 fL — ABNORMAL HIGH (ref 80.0–100.0)
Monocytes Absolute: 0.3 10*3/uL (ref 0.1–1.0)
Monocytes Relative: 7 %
Neutro Abs: 2.7 10*3/uL (ref 1.7–7.7)
Neutrophils Relative %: 54 %
Platelet Count: 179 10*3/uL (ref 150–400)
RBC: 3.35 MIL/uL — ABNORMAL LOW (ref 3.87–5.11)
RDW: 13.2 % (ref 11.5–15.5)
WBC Count: 4.9 10*3/uL (ref 4.0–10.5)
nRBC: 0 % (ref 0.0–0.2)

## 2023-07-17 NOTE — Progress Notes (Signed)
 Hematology/Oncology Consult note [facility]  [phone] [phone]  Patient Care Team: Fernand Fredy RAMAN, MD as PCP - General (Internal Medicine)   Name of the patient: Cassandra Carr  [id number]  1966/02/12   Date of visit: 07/17/23  Diagnosis- IgG lambda smoldering multiple myeloma   Chief complaint/ Reason for visit-routine follow-up of smoldering multiple myeloma  Heme/Onc history: Patient is a 58 year old female with a history of Psoriatic arthritis currently on methotrexate. 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 At baseline patient has chronic macrocytic anemia with a hemoglobin that has remained stable around 11 at least for the last 3 to 4 years. White cell count and platelets are normal. BMP was within normal limits. She had an SPEP checked which showed an elevated IgG of 1987 with an M spike of 1 g IgG lambda. Further results of blood work done for myeloma workup showed an elevated lambda light chain of 144 with a lambda kappa ratio of 8.  Anemia workup was otherwise unremarkable except for a low B12 level of 181.  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    Interval history-patient complains of hot and cold sensation especially in her lower extremity.  Appetite and weight have remained stable.  Denies any new aches and pains anywhere  ECOG PS- 1 Pain scale-0  Review of systems- Review of Systems  Constitutional:  Negative for chills, fever, malaise/fatigue and weight loss.  HENT:  Negative for congestion, ear discharge and nosebleeds.   Eyes:  Negative for blurred vision.  Respiratory:  Negative for cough, hemoptysis, sputum production, shortness of breath and wheezing.   Cardiovascular:  Negative for chest pain, palpitations, orthopnea and claudication.  Gastrointestinal:  Negative for abdominal pain, blood in stool, constipation, diarrhea, heartburn, melena, nausea and  vomiting.  Genitourinary:  Negative for dysuria, flank pain, frequency, hematuria and urgency.  Musculoskeletal:  Negative for back pain, joint pain and myalgias.  Skin:  Negative for rash.  Neurological:  Negative for dizziness, tingling, focal weakness, seizures, weakness and headaches.  Endo/Heme/Allergies:  Does not bruise/bleed easily.  Psychiatric/Behavioral:  Negative for depression and suicidal ideas. The patient does not have insomnia.       No Known Allergies   Past Medical History:  Diagnosis Date   Anemia    Anxiety    Asthma    Headache    History of kidney stones    Kidney stone    Psoriatic arthritis (HCC)    Rheumatoid arthritis (HCC)    Smoldering multiple myeloma      Past Surgical History:  Procedure Laterality Date   ABDOMINAL HYSTERECTOMY     BREAST BIOPSY Left    negative 06/2012   CHOLECYSTECTOMY     CYSTOSCOPY/URETEROSCOPY/HOLMIUM LASER/STENT PLACEMENT Right 02/06/2020   Procedure: CYSTOSCOPY/URETEROSCOPY/HOLMIUM LASER/STENT PLACEMENT;  Surgeon: Francisca Redell BROCKS, MD;  Location: [facility] ORS;  Service: Urology;  Laterality: Right;    Social History   Socioeconomic History   Marital status: Married    Spouse name: Not on file   Number of children: Not on file   Years of education: Not on file   Highest education level: Not on file  Occupational History   Not on file  Tobacco Use   Smoking status: Never   Smokeless tobacco: Never  Vaping Use   Vaping status: Never Used  Substance and Sexual Activity   Alcohol use: Never    Comment: very rare.   Drug use:  Never   Sexual activity: Yes    Birth control/protection: None  Other Topics Concern   Not on file  Social History Narrative   Not on file   Social Drivers of Health   Financial Resource Strain: Not on file  Food Insecurity: Not on file  Transportation Needs: Not on file  Physical Activity: Not on file  Stress: Not on file  Social Connections: Not on file  Intimate Partner  Violence: Not on file    Family History  Problem Relation Age of Onset   Lung cancer Mother    Esophageal cancer Father    Heart attack Paternal Grandfather    Breast cancer Neg Hx      Current Outpatient Medications:    alendronate  (FOSAMAX ) 70 MG tablet, Take 1 tablet (70 mg total) by mouth once a week., Disp: 12 tablet, Rfl: 3   ALPRAZolam  (XANAX ) 0.25 MG tablet, Take 1 tablet (0.25 mg total) by mouth at bedtime as needed for anxiety., Disp: 30 tablet, Rfl: 0   budesonide  (PULMICORT ) 180 MCG/ACT inhaler, Inhale 1 puff into the lungs 2 (two) times daily., Disp: 1 each, Rfl: 2   celecoxib  (CELEBREX ) 200 MG capsule, Take 1 capsule (200 mg total) by mouth daily., Disp: 90 capsule, Rfl: 3   cloNIDine  (CATAPRES ) 0.3 MG tablet, Take 1 tablet (0.3 mg total) by mouth daily., Disp: 90 tablet, Rfl: 3   COSENTYX SENSOREADY, 300 MG, 150 MG/ML SOAJ, Inject 2 Syringes into the skin every 28 (twenty-eight) days., Disp: , Rfl:    fluticasone  (FLONASE ) 50 MCG/ACT nasal spray, Place 1 spray into both nostrils daily., Disp: 16 g, Rfl: 6   folic acid  (FOLVITE ) 1 MG tablet, Take 1 mg by mouth daily., Disp: , Rfl:    gabapentin  (NEURONTIN ) 100 MG capsule, Take 1 capsule (100 mg total) by mouth daily., Disp: 90 capsule, Rfl: 3   methotrexate 2.5 MG tablet, SMARTSIG:5 Tablet(s) By Mouth Once a Week, Disp: , Rfl:    sertraline  (ZOLOFT ) 100 MG tablet, Take 1 tablet (100 mg total) by mouth daily., Disp: 90 tablet, Rfl: 3   SUMAtriptan  (IMITREX ) 50 MG tablet, Take 1 tablet (50 mg total) by mouth as needed for migraine (take one may repeat in 6 hours if needed)., Disp: 10 tablet, Rfl: 3   tiZANidine  (ZANAFLEX ) 4 MG tablet, Take 1 tablet (4 mg total) by mouth daily. AM, Disp: 30 tablet, Rfl: 6   Vitamin D , Ergocalciferol , 50000 units CAPS, once a week, Disp: 12 capsule, Rfl: 0  Physical exam:  Vitals:   07/17/23 0948 07/17/23 0953 07/17/23 1005  BP: (!) 181/81 (!) 187/95 (!) 162/82  Pulse: 65    Resp: 18     Temp: (!) 96.4 F (35.8 C)    TempSrc: Tympanic    SpO2: 100%    Weight: 123 lb (55.8 kg)     Physical Exam Cardiovascular:     Rate and Rhythm: Normal rate and regular rhythm.     Heart sounds: Normal heart sounds.  Pulmonary:     Effort: Pulmonary effort is normal.     Breath sounds: Normal breath sounds.  Abdominal:     General: Bowel sounds are normal.     Palpations: Abdomen is soft.  Skin:    General: Skin is warm and dry.  Neurological:     Mental Status: She is alert and oriented to person, place, and time.         Latest Ref Rng & Units 07/17/2023  9:09 AM  CMP  Glucose 70 - 99 mg/dL 83   BUN 6 - 20 mg/dL 14   Creatinine 9.55 - 1.00 mg/dL 9.30   Sodium 864 - 854 mmol/L 136   Potassium 3.5 - 5.1 mmol/L 3.8   Chloride 98 - 111 mmol/L 101   CO2 22 - 32 mmol/L 26   Calcium 8.9 - 10.3 mg/dL 9.5   Total Protein 6.5 - 8.1 g/dL 8.7   Total Bilirubin 0.0 - 1.2 mg/dL 0.7   Alkaline Phos 38 - 126 U/L 59   AST 15 - 41 U/L 26   ALT 0 - 44 U/L 24       Latest Ref Rng & Units 07/17/2023    9:08 AM  CBC  WBC 4.0 - 10.5 K/uL 4.9   Hemoglobin 12.0 - 15.0 g/dL 88.5   Hematocrit 63.9 - 46.0 % 34.1   Platelets 150 - 400 K/uL 179      Assessment and plan- Patient is a 58 y.o. female here for routine follow-up of smoldering multiple myeloma  Hemoglobin has improved from a prior value of 10.5 presently to 11.4.  Myeloma panel and serum free light chains from today are pending.  Labs from 3 months ago  showed stable M protein which fluctuates between 1 to 1.2 g.  Serum free lambda light chain has been between 1 20-1 50 with a ratio of 0.1 with no clear rising trend.  Overall her smoldering multiple myeloma appears to be stable.  No overt crab criteria.  Her symptoms of hot and cold sensation in her right leg may be related to neuropathy but likely unrelated to myeloma.  CBC with differential CMP myeloma panel and serum free light chains in 3 months in 6 months.  I will see her  back in 6 months   Visit Diagnosis 1. Smoldering multiple myeloma      Dr. Annah Skene, MD, MPH [facility] [phone] 07/17/2023 1:00 PM

## 2023-07-20 LAB — KAPPA/LAMBDA LIGHT CHAINS
Kappa free light chain: 12.3 mg/L (ref 3.3–19.4)
Kappa, lambda light chain ratio: 0.09 — ABNORMAL LOW (ref 0.26–1.65)
Lambda free light chains: 134.8 mg/L — ABNORMAL HIGH (ref 5.7–26.3)

## 2023-07-24 LAB — MULTIPLE MYELOMA PANEL, SERUM
Albumin SerPl Elph-Mcnc: 4.4 g/dL (ref 2.9–4.4)
Albumin/Glob SerPl: 1.3 (ref 0.7–1.7)
Alpha 1: 0.2 g/dL (ref 0.0–0.4)
Alpha2 Glob SerPl Elph-Mcnc: 0.6 g/dL (ref 0.4–1.0)
B-Globulin SerPl Elph-Mcnc: 1 g/dL (ref 0.7–1.3)
Gamma Glob SerPl Elph-Mcnc: 1.6 g/dL (ref 0.4–1.8)
Globulin, Total: 3.5 g/dL (ref 2.2–3.9)
IgA: 127 mg/dL (ref 87–352)
IgG (Immunoglobin G), Serum: 1931 mg/dL — ABNORMAL HIGH (ref 586–1602)
IgM (Immunoglobulin M), Srm: 45 mg/dL (ref 26–217)
M Protein SerPl Elph-Mcnc: 1.1 g/dL — ABNORMAL HIGH
Total Protein ELP: 7.9 g/dL (ref 6.0–8.5)

## 2023-08-05 ENCOUNTER — Other Ambulatory Visit: Payer: Self-pay | Admitting: Internal Medicine

## 2023-08-05 DIAGNOSIS — F411 Generalized anxiety disorder: Secondary | ICD-10-CM

## 2023-08-07 ENCOUNTER — Other Ambulatory Visit: Payer: Self-pay | Admitting: Internal Medicine

## 2023-08-07 DIAGNOSIS — F411 Generalized anxiety disorder: Secondary | ICD-10-CM

## 2023-08-17 IMAGING — MG MM DIGITAL SCREENING BILAT W/ TOMO AND CAD
8 series · 9 of 24 positions shown · non-contrast
Comparison: Previous exam(s).

CLINICAL DATA: Screening.

EXAM:
DIGITAL SCREENING BILATERAL MAMMOGRAM WITH TOMOSYNTHESIS AND CAD
TECHNIQUE: Bilateral screening digital craniocaudal and mediolateral oblique
mammograms were obtained. Bilateral screening digital breast
tomosynthesis was performed. The images were evaluated with
computer-aided detection.

[R CC synth-2D]
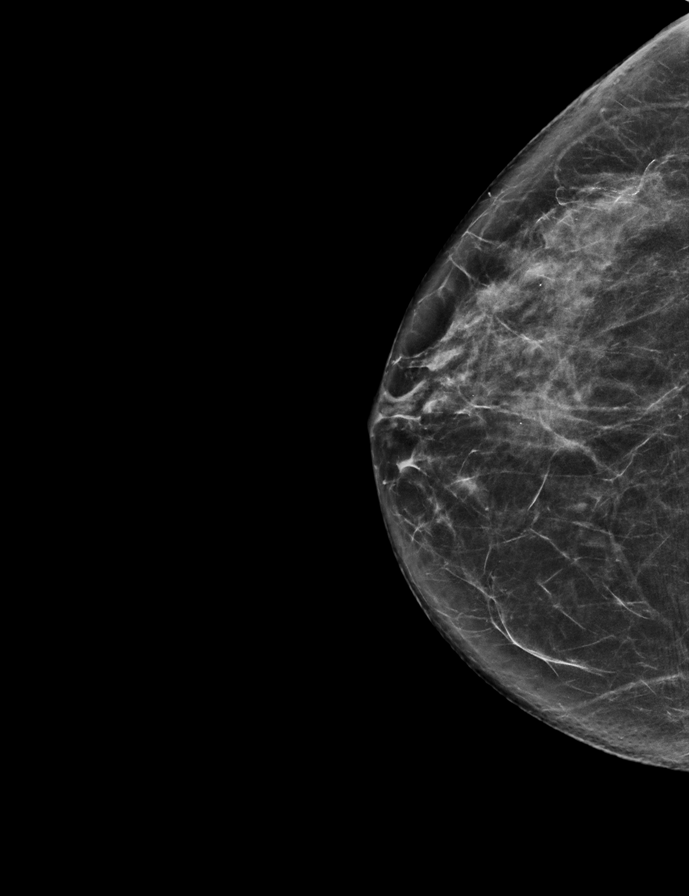

[L MLO synth-2D]
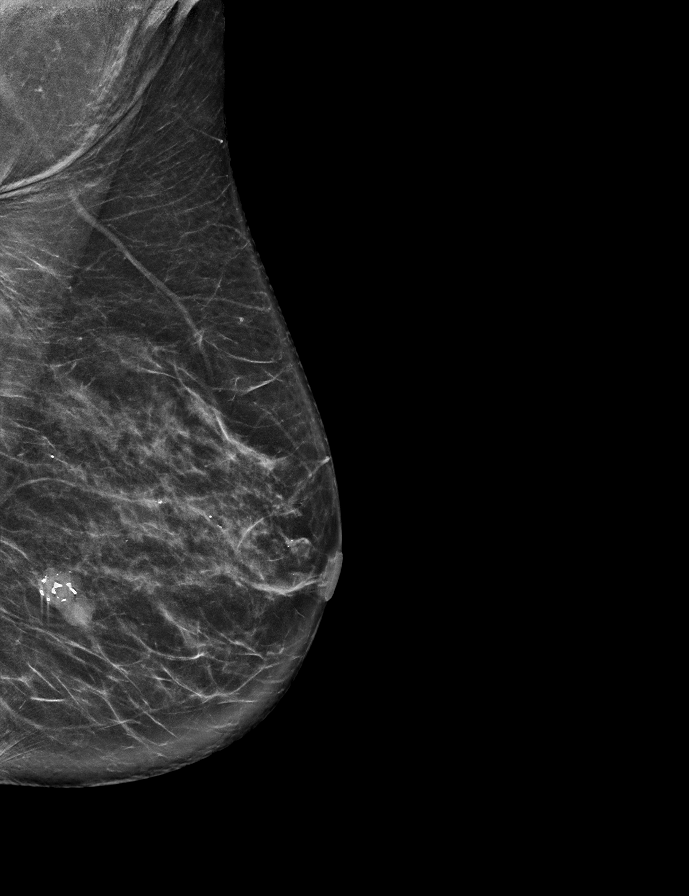

[L CC synth-2D]
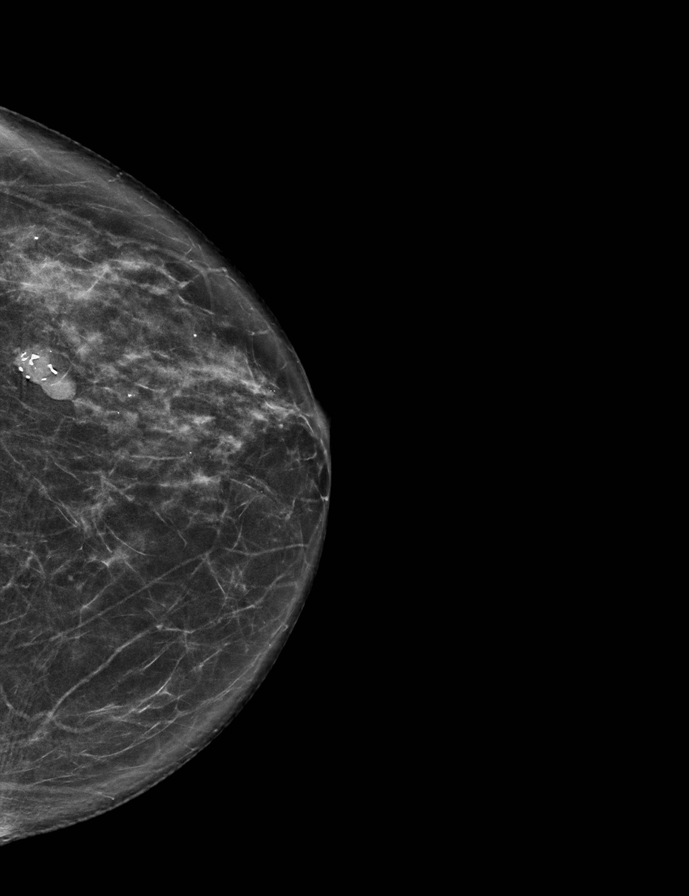

[R MLO synth-2D]
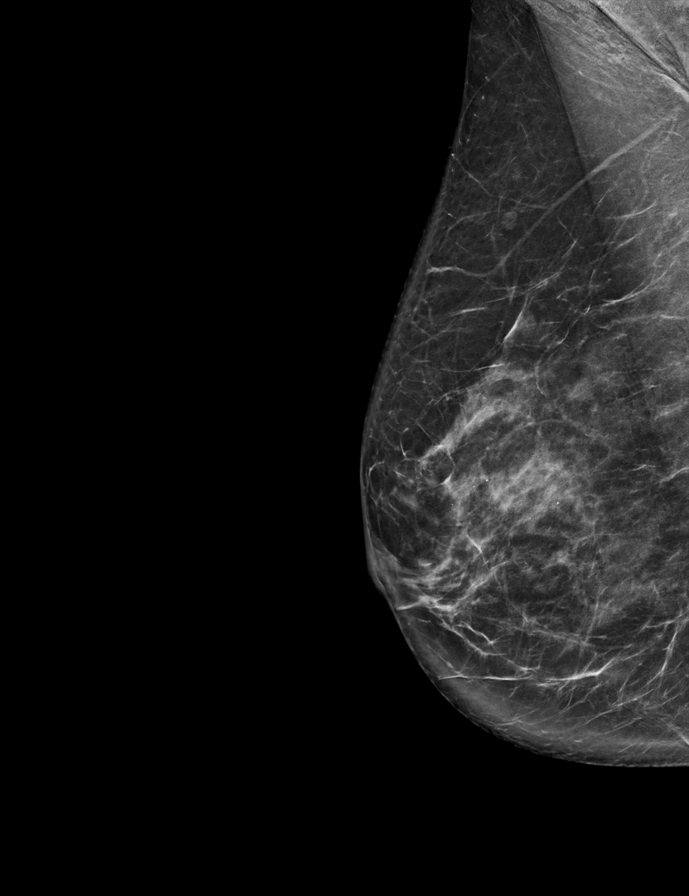

[R CC tomo · 2 of 66 frames shown]
[frame 22/66]
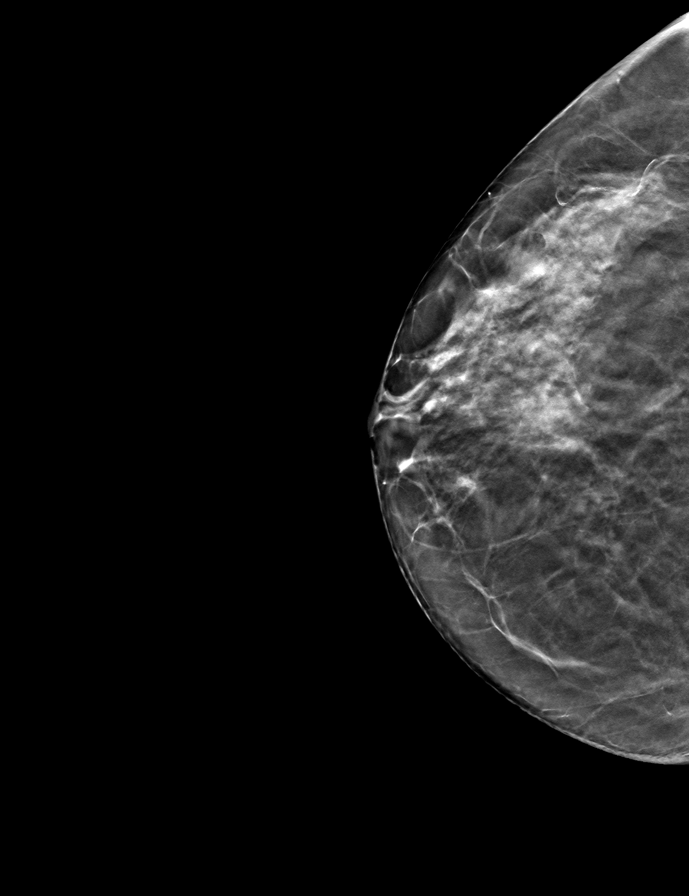
[frame 33/66]
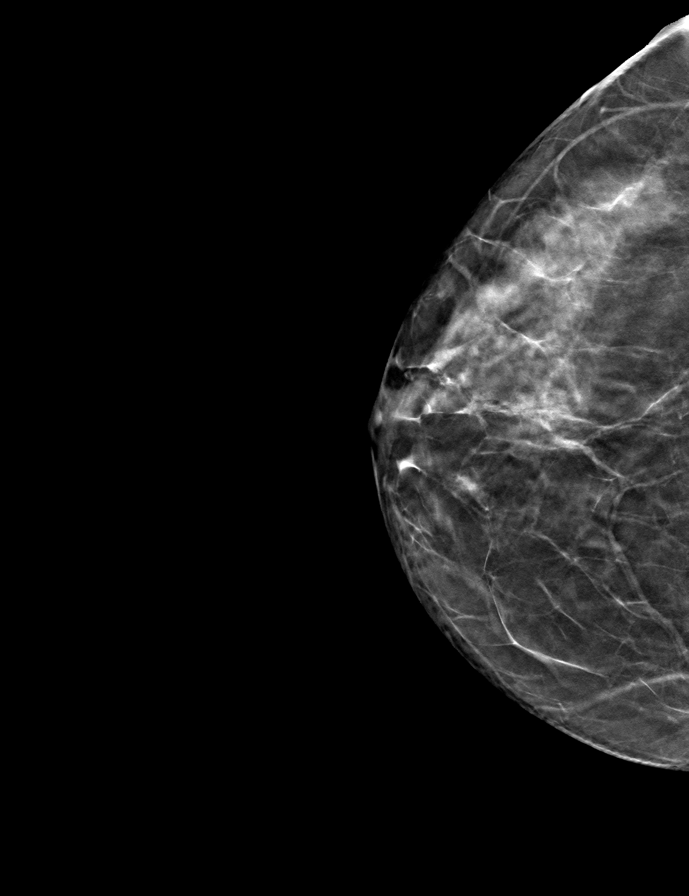

[R MLO tomo · tomo slice 37/73.0]
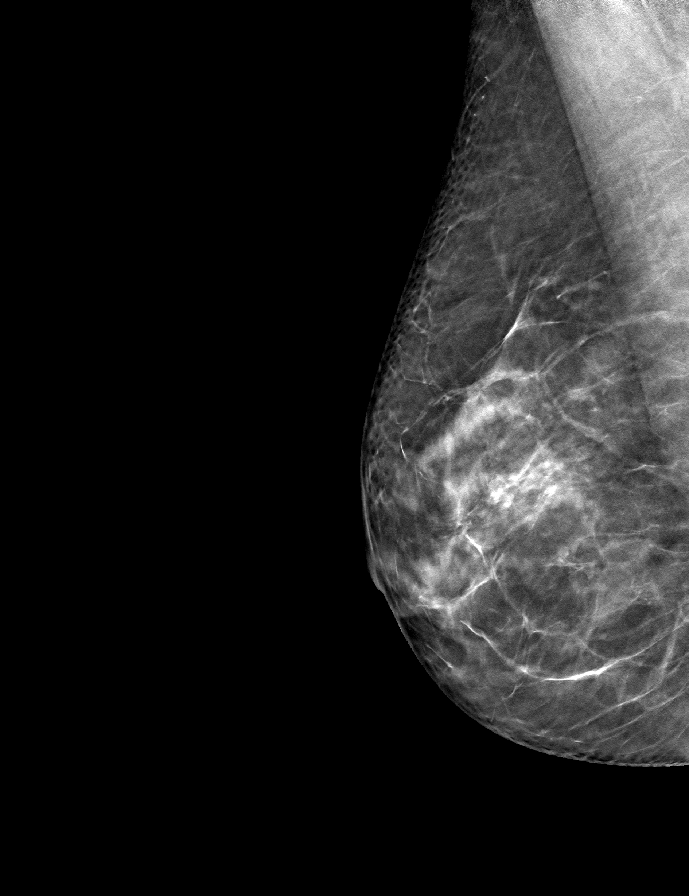

[L MLO tomo · tomo slice 36/71.0]
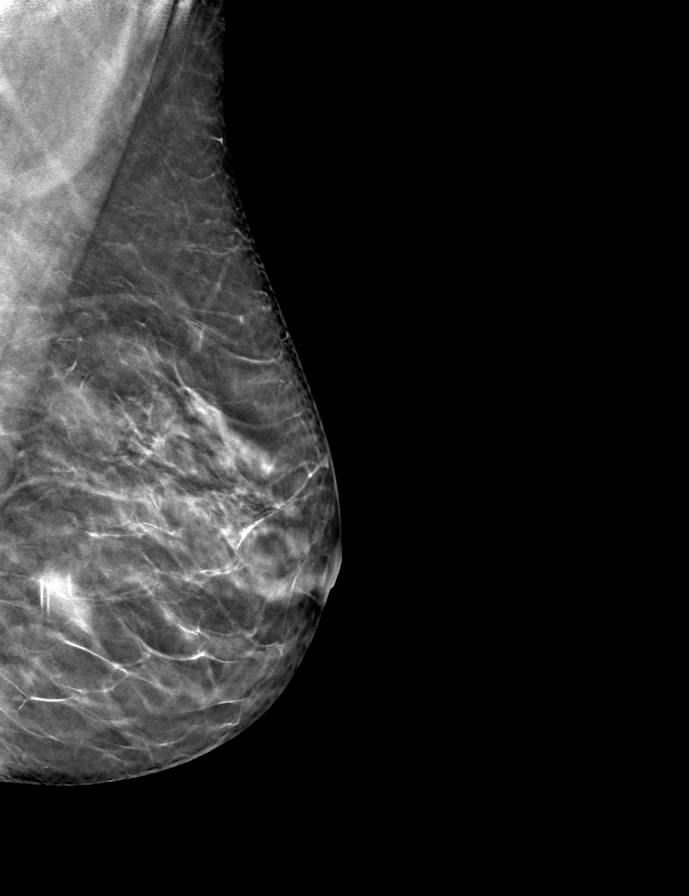

[L CC tomo · tomo slice 33/65.0]
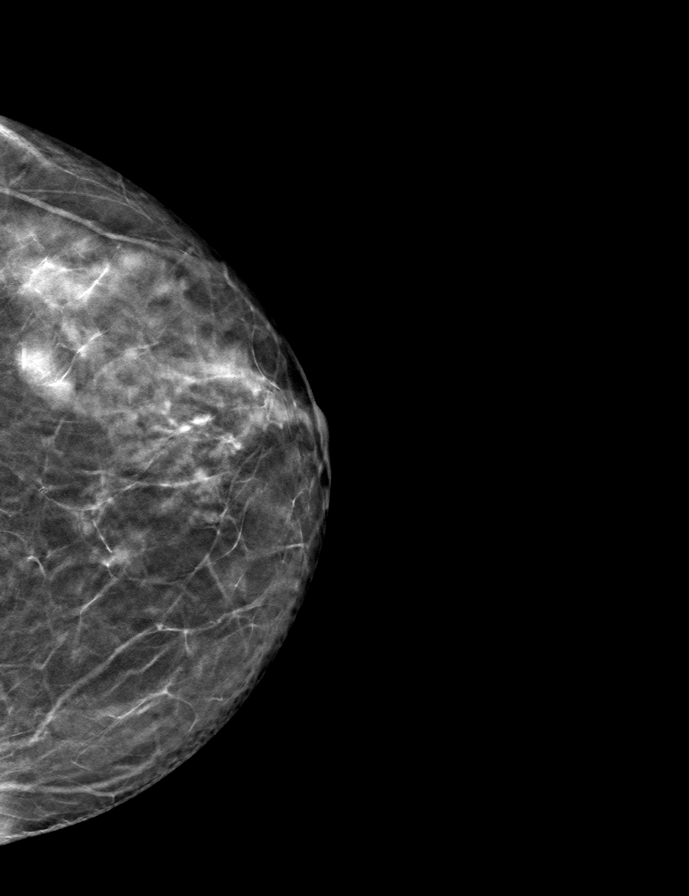

[9 of 24 positions shown; findings below may reference images not displayed]

ACR Breast Density Category c: The breast tissue is heterogeneously
dense, which may obscure small masses.
FINDINGS: There are no findings suspicious for malignancy.
IMPRESSION: No mammographic evidence of malignancy. A result letter of this
screening mammogram will be mailed directly to the patient.

RECOMMENDATION:
Screening mammogram in one year. (Code:Q3-W-BC3)

BI-RADS CATEGORY  1: Negative.

## 2023-08-31 ENCOUNTER — Other Ambulatory Visit: Payer: Self-pay | Admitting: Internal Medicine

## 2023-08-31 DIAGNOSIS — L4050 Arthropathic psoriasis, unspecified: Secondary | ICD-10-CM

## 2023-09-03 ENCOUNTER — Other Ambulatory Visit: Payer: Self-pay | Admitting: Internal Medicine

## 2023-09-03 DIAGNOSIS — F411 Generalized anxiety disorder: Secondary | ICD-10-CM

## 2023-09-04 ENCOUNTER — Other Ambulatory Visit: Payer: Self-pay | Admitting: Family

## 2023-09-15 ENCOUNTER — Encounter: Payer: Self-pay | Admitting: Internal Medicine

## 2023-09-15 ENCOUNTER — Ambulatory Visit (INDEPENDENT_AMBULATORY_CARE_PROVIDER_SITE_OTHER): Payer: BC Managed Care – PPO | Admitting: Internal Medicine

## 2023-09-15 VITALS — BP 122/82 | HR 72 | Ht 61.0 in | Wt 120.8 lb

## 2023-09-15 DIAGNOSIS — D472 Monoclonal gammopathy: Secondary | ICD-10-CM

## 2023-09-15 DIAGNOSIS — J301 Allergic rhinitis due to pollen: Secondary | ICD-10-CM

## 2023-09-15 DIAGNOSIS — E782 Mixed hyperlipidemia: Secondary | ICD-10-CM

## 2023-09-15 DIAGNOSIS — Z0130 Encounter for examination of blood pressure without abnormal findings: Secondary | ICD-10-CM

## 2023-09-15 DIAGNOSIS — F411 Generalized anxiety disorder: Secondary | ICD-10-CM

## 2023-09-15 DIAGNOSIS — Z1231 Encounter for screening mammogram for malignant neoplasm of breast: Secondary | ICD-10-CM

## 2023-09-15 DIAGNOSIS — L4050 Arthropathic psoriasis, unspecified: Secondary | ICD-10-CM

## 2023-09-15 NOTE — Progress Notes (Signed)
 Established Patient Office Visit  Subjective:  Patient ID: Cassandra Carr, female    DOB: 07-21-1965  Age: 58 y.o. MRN: [id number]  Chief Complaint  Patient presents with   Follow-up    4 month follow up    Patient comes in for her follow-up today.  She is generally feeling well.  Mentions occasional anxiety episodes for which she takes a small dose of Xanax, but rarely.  Currently taking all her medications regularly.  She had a few elevated blood pressure readings, but today it is 122/82.  Patient advised to monitor her blood pressure at home. Order sent for her mammogram.  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    No other concerns at this time.   Past Medical History:  Diagnosis Date   Anemia    Anxiety    Asthma    Headache    History of kidney stones    Kidney stone    Psoriatic arthritis (HCC)    Rheumatoid arthritis (HCC)    Smoldering multiple myeloma     Past Surgical History:  Procedure Laterality Date   ABDOMINAL HYSTERECTOMY     BREAST BIOPSY Left    negative 06/2012   CHOLECYSTECTOMY     CYSTOSCOPY/URETEROSCOPY/HOLMIUM LASER/STENT PLACEMENT Right 02/06/2020   Procedure: CYSTOSCOPY/URETEROSCOPY/HOLMIUM LASER/STENT PLACEMENT;  Surgeon: Sondra Come, MD;  Location: [facility] ORS;  Service: Urology;  Laterality: Right;    Social History   Socioeconomic History   Marital status: Married    Spouse name: Not on file   Number of children: Not on file   Years of education: Not on file   Highest education level: Not on file  Occupational History   Not on file  Tobacco Use   Smoking status: Never   Smokeless tobacco: Never  Vaping Use   Vaping status: Never Used  Substance and Sexual Activity   Alcohol use: Never    Comment: very rare.   Drug use: Never   Sexual activity: Yes    Birth control/protection: None  Other Topics Concern   Not on file  Social History Narrative   Not on file    Social Drivers of Health   Financial Resource Strain: Not on file  Food Insecurity: Not on file  Transportation Needs: Not on file  Physical Activity: Not on file  Stress: Not on file  Social Connections: Not on file  Intimate Partner Violence: Not on file    Family History  Problem Relation Age of Onset   Lung cancer Mother    Esophageal cancer Father    Heart attack Paternal Grandfather    Breast cancer Neg Hx     No Known Allergies  Outpatient Medications Prior to Visit  Medication Sig   alendronate (FOSAMAX) 70 MG tablet Take 1 tablet (70 mg total) by mouth once a week.   ALPRAZolam (XANAX) 0.25 MG tablet Take 1 tablet (0.25 mg total) by mouth at bedtime as needed for anxiety.   budesonide (PULMICORT) 180 MCG/ACT inhaler Inhale 1 puff into the lungs 2 (two) times daily.   Calcium Carbonate-Vitamin D (CALCIUM-VITAMIN D PO) Take by mouth.   celecoxib (CELEBREX) 200 MG capsule Take 1 capsule (200 mg total) by mouth daily.   cloNIDine (CATAPRES) 0.3 MG tablet Take 1 tablet (0.3 mg total) by mouth daily.   fluticasone (FLONASE) 50 MCG/ACT nasal spray Place 1 spray into both nostrils daily.   folic acid (FOLVITE) 1 MG  tablet Take 1 mg by mouth daily.   gabapentin (NEURONTIN) 100 MG capsule Take 1 capsule (100 mg total) by mouth daily.   methotrexate 2.5 MG tablet SMARTSIG:5 Tablet(s) By Mouth Once a Week   sertraline (ZOLOFT) 100 MG tablet Take 1 tablet (100 mg total) by mouth daily.   SUMAtriptan (IMITREX) 50 MG tablet Take 1 tablet (50 mg total) by mouth as needed for migraine (take one may repeat in 6 hours if needed).   TALTZ 80 MG/ML pen Inject 80 mg into the skin every 28 (twenty-eight) days.   tiZANidine (ZANAFLEX) 4 MG tablet Take 1 tablet (4 mg total) by mouth daily. AM   Vitamin D, Ergocalciferol, 50000 units CAPS once a week   COSENTYX SENSOREADY, 300 MG, 150 MG/ML SOAJ Inject 2 Syringes into the skin every 28 (twenty-eight) days. (Patient not taking: Reported on  09/15/2023)   No facility-administered medications prior to visit.    Review of Systems  Constitutional: Negative.  Negative for chills, fever, malaise/fatigue and weight loss.  HENT: Negative.  Negative for sinus pain and sore throat.   Eyes: Negative.   Respiratory: Negative.  Negative for cough and shortness of breath.   Cardiovascular: Negative.  Negative for chest pain, palpitations and leg swelling.  Gastrointestinal: Negative.  Negative for abdominal pain, constipation, diarrhea, heartburn, nausea and vomiting.  Genitourinary: Negative.  Negative for dysuria and flank pain.  Musculoskeletal: Negative.  Negative for joint pain and myalgias.  Skin: Negative.   Neurological: Negative.  Negative for dizziness, tingling, tremors and headaches.  Endo/Heme/Allergies: Negative.   Psychiatric/Behavioral: Negative.  Negative for depression and suicidal ideas. The patient is not nervous/anxious.        Objective:   BP 122/82   Pulse 72   Ht 5\' 1"  (1.549 m)   Wt 120 lb 12.8 oz (54.8 kg)   SpO2 98%   BMI 22.82 kg/m   Vitals:   09/15/23 0914  BP: 122/82  Pulse: 72  Height: 5\' 1"  (1.549 m)  Weight: 120 lb 12.8 oz (54.8 kg)  SpO2: 98%  BMI (Calculated): 22.84    Physical Exam Vitals and nursing note reviewed.  Constitutional:      Appearance: Normal appearance.  HENT:     Head: Normocephalic and atraumatic.     Nose: Nose normal.     Mouth/Throat:     Mouth: Mucous membranes are moist.     Pharynx: Oropharynx is clear.  Eyes:     Conjunctiva/sclera: Conjunctivae normal.     Pupils: Pupils are equal, round, and reactive to light.  Cardiovascular:     Rate and Rhythm: Normal rate and regular rhythm.     Pulses: Normal pulses.     Heart sounds: Normal heart sounds. No murmur heard. Pulmonary:     Effort: Pulmonary effort is normal.     Breath sounds: Normal breath sounds. No wheezing.  Abdominal:     General: Bowel sounds are normal.     Palpations: Abdomen is soft.      Tenderness: There is no abdominal tenderness. There is no right CVA tenderness or left CVA tenderness.  Musculoskeletal:        General: Normal range of motion.     Cervical back: Normal range of motion.     Right lower leg: No edema.     Left lower leg: No edema.  Skin:    General: Skin is warm and dry.  Neurological:     General: No focal deficit present.  Mental Status: She is alert and oriented to person, place, and time.  Psychiatric:        Mood and Affect: Mood normal.        Behavior: Behavior normal.      No results found for any visits on 09/15/23.  Recent Results (from the past 2160 hours)  Kappa/lambda light chains     Status: Abnormal   Collection Time: 07/17/23  9:08 AM  Result Value Ref Range   Kappa free light chain 12.3 3.3 - 19.4 mg/L   Lambda free light chains 134.8 (H) 5.7 - 26.3 mg/L   Kappa, lambda light chain ratio 0.09 (L) 0.26 - 1.65    Comment: (NOTE) Performed At: [facility] [address] [place], [place] [id number] Jolene Schimke MD NW:[id number]   CBC with Differential (Cancer Center Only)     Status: Abnormal   Collection Time: 07/17/23  9:08 AM  Result Value Ref Range   WBC Count 4.9 4.0 - 10.5 K/uL   RBC 3.35 (L) 3.87 - 5.11 MIL/uL   Hemoglobin 11.4 (L) 12.0 - 15.0 g/dL   HCT 57.8 (L) 46.9 - 62.9 %   MCV 101.8 (H) 80.0 - 100.0 fL   MCH 34.0 26.0 - 34.0 pg   MCHC 33.4 30.0 - 36.0 g/dL   RDW 52.8 41.3 - 24.4 %   Platelet Count 179 150 - 400 K/uL   nRBC 0.0 0.0 - 0.2 %   Neutrophils Relative % 54 %   Neutro Abs 2.7 1.7 - 7.7 K/uL   Lymphocytes Relative 38 %   Lymphs Abs 1.9 0.7 - 4.0 K/uL   Monocytes Relative 7 %   Monocytes Absolute 0.3 0.1 - 1.0 K/uL   Eosinophils Relative 0 %   Eosinophils Absolute 0.0 0.0 - 0.5 K/uL   Basophils Relative 0 %   Basophils Absolute 0.0 0.0 - 0.1 K/uL   Immature Granulocytes 1 %   Abs Immature Granulocytes 0.03 0.00 - 0.07 K/uL    Comment: Performed at [facility], [address] [place], [place] [address]  Multiple Myeloma Panel (SPEP&IFE w/QIG)     Status: Abnormal   Collection Time: 07/17/23  9:09 AM  Result Value Ref Range   IgG (Immunoglobin G), Serum 1,931 (H) 586 - 1,602 mg/dL   IgA 253 87 - 664 mg/dL   IgM (Immunoglobulin M), Srm 45 26 - 217 mg/dL   Total Protein ELP 7.9 6.0 - 8.5 g/dL   Albumin SerPl Elph-Mcnc 4.4 2.9 - 4.4 g/dL   Alpha 1 0.2 0.0 - 0.4 g/dL   Alpha2 Glob SerPl Elph-Mcnc 0.6 0.4 - 1.0 g/dL   B-Globulin SerPl Elph-Mcnc 1.0 0.7 - 1.3 g/dL   Gamma Glob SerPl Elph-Mcnc 1.6 0.4 - 1.8 g/dL   M Protein SerPl Elph-Mcnc 1.1 (H) Not Observed g/dL   Globulin, Total 3.5 2.2 - 3.9 g/dL   Albumin/Glob SerPl 1.3 0.7 - 1.7   IFE 1 Comment (A)     Comment: (NOTE) Immunofixation shows IgG monoclonal protein with lambda light chain specificity.    Please Note Comment     Comment: (NOTE) Protein electrophoresis scan will follow via computer, mail, or courier delivery. Performed At: [facility] [address] [place], [place] [id number] Jolene Schimke MD DG:[id number]   CMP (Cancer Center only)     Status: Abnormal   Collection Time: 07/17/23  9:09 AM  Result Value Ref Range   Sodium 136 135 - 145 mmol/L   Potassium 3.8 3.5 -  5.1 mmol/L   Chloride 101 98 - 111 mmol/L   CO2 26 22 - 32 mmol/L   Glucose, Bld 83 70 - 99 mg/dL    Comment: Glucose reference range applies only to samples taken after fasting for at least 8 hours.   BUN 14 6 - 20 mg/dL   Creatinine 1.61 0.96 - 1.00 mg/dL   Calcium 9.5 8.9 - 04.5 mg/dL   Total Protein 8.7 (H) 6.5 - 8.1 g/dL   Albumin 4.8 3.5 - 5.0 g/dL   AST 26 15 - 41 U/L   ALT 24 0 - 44 U/L   Alkaline Phosphatase 59 38 - 126 U/L   Total Bilirubin 0.7 0.0 - 1.2 mg/dL   GFR, Estimated >40 >98 mL/min    Comment: (NOTE) Calculated using the CKD-EPI Creatinine Equation (2021)    Anion gap 9 5 - 15    Comment: Performed at [facility], [address], [place], [place] [address]       Assessment & Plan:  Patient will continue her medications.  Monitor blood pressure at home. Problem List Items Addressed This Visit     Psoriatic arthritis (HCC)   GAD (generalized anxiety disorder)   Mixed hyperlipidemia - Primary   MGUS (monoclonal gammopathy of unknown significance)   Non-seasonal allergic rhinitis due to pollen   Other Visit Diagnoses       Breast cancer screening by mammogram       Relevant Orders   MM 3D SCREENING MAMMOGRAM BILATERAL BREAST       Return in about 6 months (around 03/17/2024).   Total time spent: 30 minutes  Margaretann Loveless, MD  09/15/2023   This document may have been prepared by [facility] Voice Recognition software and as such may include unintentional dictation errors.

## 2023-09-27 ENCOUNTER — Other Ambulatory Visit: Payer: Self-pay | Admitting: Cardiology

## 2023-09-27 DIAGNOSIS — L4050 Arthropathic psoriasis, unspecified: Secondary | ICD-10-CM

## 2023-10-12 ENCOUNTER — Other Ambulatory Visit: Payer: Self-pay | Admitting: Internal Medicine

## 2023-10-12 ENCOUNTER — Telehealth: Payer: Self-pay | Admitting: Internal Medicine

## 2023-10-12 ENCOUNTER — Other Ambulatory Visit: Payer: Self-pay | Admitting: Cardiology

## 2023-10-12 DIAGNOSIS — J301 Allergic rhinitis due to pollen: Secondary | ICD-10-CM

## 2023-10-12 DIAGNOSIS — J0100 Acute maxillary sinusitis, unspecified: Secondary | ICD-10-CM

## 2023-10-12 DIAGNOSIS — L4050 Arthropathic psoriasis, unspecified: Secondary | ICD-10-CM

## 2023-10-12 DIAGNOSIS — F411 Generalized anxiety disorder: Secondary | ICD-10-CM

## 2023-10-12 MED ORDER — FEXOFENADINE HCL 180 MG PO TABS: 180.0000 mg | ORAL_TABLET | Freq: Every day | ORAL | 3 refills | Status: AC

## 2023-10-12 MED ORDER — AZELASTINE HCL 0.1 % NA SOLN
2.0000 | Freq: Every day | NASAL | 3 refills | Status: DC
Start: 2023-10-12 — End: 2024-03-14

## 2023-10-12 MED ORDER — AMOXICILLIN-POT CLAVULANATE 875-125 MG PO TABS: 1.0000 | ORAL_TABLET | Freq: Two times a day (BID) | ORAL | 0 refills | Status: DC

## 2023-10-12 NOTE — Telephone Encounter (Signed)
 PT called wanting to know if she could have something called in for her severe congestion. Was last seen on 09/15/23 & still having congestion going on Please advise

## 2023-10-14 ENCOUNTER — Other Ambulatory Visit: Payer: Self-pay | Admitting: Cardiology

## 2023-10-14 DIAGNOSIS — L4050 Arthropathic psoriasis, unspecified: Secondary | ICD-10-CM

## 2023-10-15 ENCOUNTER — Inpatient Hospital Stay: Payer: BC Managed Care – PPO | Attending: Oncology

## 2023-10-15 DIAGNOSIS — D472 Monoclonal gammopathy: Secondary | ICD-10-CM | POA: Diagnosis present

## 2023-10-15 LAB — CBC WITH DIFFERENTIAL/PLATELET
Abs Immature Granulocytes: 0.03 10*3/uL (ref 0.00–0.07)
Basophils Absolute: 0 10*3/uL (ref 0.0–0.1)
Basophils Relative: 1 %
Eosinophils Absolute: 0.1 10*3/uL (ref 0.0–0.5)
Eosinophils Relative: 3 %
HCT: 31.7 % — ABNORMAL LOW (ref 36.0–46.0)
Hemoglobin: 10.6 g/dL — ABNORMAL LOW (ref 12.0–15.0)
Immature Granulocytes: 1 %
Lymphocytes Relative: 24 %
Lymphs Abs: 1.2 10*3/uL (ref 0.7–4.0)
MCH: 34.6 pg — ABNORMAL HIGH (ref 26.0–34.0)
MCHC: 33.4 g/dL (ref 30.0–36.0)
MCV: 103.6 fL — ABNORMAL HIGH (ref 80.0–100.0)
Monocytes Absolute: 0.4 10*3/uL (ref 0.1–1.0)
Monocytes Relative: 9 %
Neutro Abs: 3.1 10*3/uL (ref 1.7–7.7)
Neutrophils Relative %: 62 %
Platelets: 178 10*3/uL (ref 150–400)
RBC: 3.06 MIL/uL — ABNORMAL LOW (ref 3.87–5.11)
RDW: 12.8 % (ref 11.5–15.5)
WBC: 4.9 10*3/uL (ref 4.0–10.5)
nRBC: 0 % (ref 0.0–0.2)

## 2023-10-15 LAB — COMPREHENSIVE METABOLIC PANEL WITH GFR
ALT: 11 U/L (ref 0–44)
AST: 16 U/L (ref 15–41)
Albumin: 3.6 g/dL (ref 3.5–5.0)
Alkaline Phosphatase: 72 U/L (ref 38–126)
Anion gap: 6 (ref 5–15)
BUN: 19 mg/dL (ref 6–20)
CO2: 26 mmol/L (ref 22–32)
Calcium: 9.3 mg/dL (ref 8.9–10.3)
Chloride: 101 mmol/L (ref 98–111)
Creatinine, Ser: 0.92 mg/dL (ref 0.44–1.00)
GFR, Estimated: >60 mL/min (ref >60)
Glucose, Bld: 108 mg/dL — ABNORMAL HIGH (ref 70–99)
Potassium: 3.8 mmol/L (ref 3.5–5.1)
Sodium: 133 mmol/L — ABNORMAL LOW (ref 135–145)
Total Bilirubin: 0.3 mg/dL (ref 0.0–1.2)
Total Protein: 8.1 g/dL (ref 6.5–8.1)

## 2023-10-16 LAB — KAPPA/LAMBDA LIGHT CHAINS
Kappa free light chain: 16.5 mg/L (ref 3.3–19.4)
Kappa, lambda light chain ratio: 0.07 — ABNORMAL LOW (ref 0.26–1.65)
Lambda free light chains: 232.4 mg/L — ABNORMAL HIGH (ref 5.7–26.3)

## 2023-10-18 LAB — MULTIPLE MYELOMA PANEL, SERUM
Albumin SerPl Elph-Mcnc: 3.4 g/dL (ref 2.9–4.4)
Albumin/Glob SerPl: 0.9 (ref 0.7–1.7)
Alpha 1: 0.3 g/dL (ref 0.0–0.4)
Alpha2 Glob SerPl Elph-Mcnc: 1 g/dL (ref 0.4–1.0)
B-Globulin SerPl Elph-Mcnc: 1 g/dL (ref 0.7–1.3)
Gamma Glob SerPl Elph-Mcnc: 1.5 g/dL (ref 0.4–1.8)
Globulin, Total: 3.8 g/dL (ref 2.2–3.9)
IgA: 123 mg/dL (ref 87–352)
IgG (Immunoglobin G), Serum: 1905 mg/dL — ABNORMAL HIGH (ref 586–1602)
IgM (Immunoglobulin M), Srm: 46 mg/dL (ref 26–217)
M Protein SerPl Elph-Mcnc: 1 g/dL — ABNORMAL HIGH
Total Protein ELP: 7.2 g/dL (ref 6.0–8.5)

## 2023-10-26 ENCOUNTER — Other Ambulatory Visit: Payer: Self-pay | Admitting: Internal Medicine

## 2023-10-26 DIAGNOSIS — M85851 Other specified disorders of bone density and structure, right thigh: Secondary | ICD-10-CM

## 2023-11-12 ENCOUNTER — Other Ambulatory Visit: Payer: Self-pay | Admitting: Internal Medicine

## 2023-11-12 DIAGNOSIS — L4050 Arthropathic psoriasis, unspecified: Secondary | ICD-10-CM

## 2023-11-24 ENCOUNTER — Other Ambulatory Visit: Payer: Self-pay | Admitting: Family

## 2024-01-01 ENCOUNTER — Other Ambulatory Visit: Payer: Self-pay | Admitting: Internal Medicine

## 2024-01-01 DIAGNOSIS — R232 Flushing: Secondary | ICD-10-CM

## 2024-01-04 ENCOUNTER — Other Ambulatory Visit: Payer: Self-pay | Admitting: Internal Medicine

## 2024-01-04 DIAGNOSIS — F411 Generalized anxiety disorder: Secondary | ICD-10-CM

## 2024-01-15 ENCOUNTER — Ambulatory Visit: Payer: BC Managed Care – PPO | Admitting: Oncology

## 2024-01-15 ENCOUNTER — Other Ambulatory Visit: Payer: Self-pay | Admitting: Internal Medicine

## 2024-01-15 ENCOUNTER — Inpatient Hospital Stay: Payer: BC Managed Care – PPO | Attending: Oncology

## 2024-01-15 ENCOUNTER — Other Ambulatory Visit: Payer: BC Managed Care – PPO

## 2024-01-15 DIAGNOSIS — D472 Monoclonal gammopathy: Secondary | ICD-10-CM | POA: Diagnosis present

## 2024-01-15 DIAGNOSIS — M85851 Other specified disorders of bone density and structure, right thigh: Secondary | ICD-10-CM

## 2024-01-15 LAB — CBC WITH DIFFERENTIAL/PLATELET
Abs Immature Granulocytes: 0.01 K/uL (ref 0.00–0.07)
Basophils Absolute: 0 K/uL (ref 0.0–0.1)
Basophils Relative: 1 %
Eosinophils Absolute: 0.1 K/uL (ref 0.0–0.5)
Eosinophils Relative: 4 %
HCT: 32.8 % — ABNORMAL LOW (ref 36.0–46.0)
Hemoglobin: 11.1 g/dL — ABNORMAL LOW (ref 12.0–15.0)
Immature Granulocytes: 0 %
Lymphocytes Relative: 32 %
Lymphs Abs: 1 K/uL (ref 0.7–4.0)
MCH: 34.9 pg — ABNORMAL HIGH (ref 26.0–34.0)
MCHC: 33.8 g/dL (ref 30.0–36.0)
MCV: 103.1 fL — ABNORMAL HIGH (ref 80.0–100.0)
Monocytes Absolute: 0.2 K/uL (ref 0.1–1.0)
Monocytes Relative: 7 %
Neutro Abs: 1.7 K/uL (ref 1.7–7.7)
Neutrophils Relative %: 56 %
Platelets: 144 K/uL — ABNORMAL LOW (ref 150–400)
RBC: 3.18 MIL/uL — ABNORMAL LOW (ref 3.87–5.11)
RDW: 13.1 % (ref 11.5–15.5)
WBC: 3.1 K/uL — ABNORMAL LOW (ref 4.0–10.5)
nRBC: 0 % (ref 0.0–0.2)

## 2024-01-15 LAB — COMPREHENSIVE METABOLIC PANEL WITH GFR
ALT: 57 U/L — ABNORMAL HIGH (ref 0–44)
AST: 47 U/L — ABNORMAL HIGH (ref 15–41)
Albumin: 4.1 g/dL (ref 3.5–5.0)
Alkaline Phosphatase: 56 U/L (ref 38–126)
Anion gap: 7 (ref 5–15)
BUN: 16 mg/dL (ref 6–20)
CO2: 26 mmol/L (ref 22–32)
Calcium: 9.3 mg/dL (ref 8.9–10.3)
Chloride: 102 mmol/L (ref 98–111)
Creatinine, Ser: 1.05 mg/dL — ABNORMAL HIGH (ref 0.44–1.00)
GFR, Estimated: >60 mL/min (ref >60)
Glucose, Bld: 108 mg/dL — ABNORMAL HIGH (ref 70–99)
Potassium: 4.1 mmol/L (ref 3.5–5.1)
Sodium: 135 mmol/L (ref 135–145)
Total Bilirubin: 0.6 mg/dL (ref 0.0–1.2)
Total Protein: 7.5 g/dL (ref 6.5–8.1)

## 2024-01-18 LAB — KAPPA/LAMBDA LIGHT CHAINS
Kappa free light chain: 15.8 mg/L (ref 3.3–19.4)
Kappa, lambda light chain ratio: 0.07 — ABNORMAL LOW (ref 0.26–1.65)
Lambda free light chains: 212.3 mg/L — ABNORMAL HIGH (ref 5.7–26.3)

## 2024-01-19 LAB — MULTIPLE MYELOMA PANEL, SERUM
Albumin SerPl Elph-Mcnc: 3.7 g/dL (ref 2.9–4.4)
Albumin/Glob SerPl: 1.1 (ref 0.7–1.7)
Alpha 1: 0.2 g/dL (ref 0.0–0.4)
Alpha2 Glob SerPl Elph-Mcnc: 0.7 g/dL (ref 0.4–1.0)
B-Globulin SerPl Elph-Mcnc: 0.9 g/dL (ref 0.7–1.3)
Gamma Glob SerPl Elph-Mcnc: 1.5 g/dL (ref 0.4–1.8)
Globulin, Total: 3.4 g/dL (ref 2.2–3.9)
IgA: 102 mg/dL (ref 87–352)
IgG (Immunoglobin G), Serum: 1936 mg/dL — ABNORMAL HIGH (ref 586–1602)
IgM (Immunoglobulin M), Srm: 38 mg/dL (ref 26–217)
M Protein SerPl Elph-Mcnc: 1.1 g/dL — ABNORMAL HIGH
Total Protein ELP: 7.1 g/dL (ref 6.0–8.5)

## 2024-02-07 ENCOUNTER — Other Ambulatory Visit: Payer: Self-pay | Admitting: Internal Medicine

## 2024-02-07 DIAGNOSIS — L4050 Arthropathic psoriasis, unspecified: Secondary | ICD-10-CM

## 2024-02-08 ENCOUNTER — Other Ambulatory Visit: Payer: Self-pay | Admitting: Internal Medicine

## 2024-02-08 DIAGNOSIS — L4050 Arthropathic psoriasis, unspecified: Secondary | ICD-10-CM

## 2024-02-09 ENCOUNTER — Encounter: Payer: Self-pay | Admitting: Oncology

## 2024-02-09 ENCOUNTER — Inpatient Hospital Stay: Attending: Oncology | Admitting: Oncology

## 2024-02-09 VITALS — BP 127/79 | HR 66 | Temp 97.8°F | Resp 18 | Ht 61.0 in | Wt 123.3 lb

## 2024-02-09 DIAGNOSIS — D472 Monoclonal gammopathy: Secondary | ICD-10-CM | POA: Insufficient documentation

## 2024-02-09 NOTE — Progress Notes (Signed)
 Hematology/Oncology Consult note [facility]  [phone] [phone]  Patient Care Team: Fernand Fredy RAMAN, MD as PCP - General (Internal Medicine)   Name of the patient: Cassandra Carr  [id number]  1966/02/11   Date of visit: 02/09/24  Diagnosis-IgG lambda smoldering multiple myeloma  Chief complaint/ Reason for visit-routine follow-up of smoldering multiple myeloma  Heme/Onc history: Patient is a 58 year old female with a history of Psoriatic arthritis currently on methotrexate. 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 At baseline patient has chronic macrocytic anemia with a hemoglobin that has remained stable around 11 at least for the last 3 to 4 years. White cell count and platelets are normal. BMP was within normal limits. She had an SPEP checked which showed an elevated IgG of 1987 with an M spike of 1 g IgG lambda. Further results of blood work done for myeloma workup showed an elevated lambda light chain of 144 with a lambda kappa ratio of 8.  Anemia workup was otherwise unremarkable except for a low B12 level of 181.  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   Bone marrow biopsy in December 2023 showed variably cellular bone marrow with 13% clonal plasma cells.  Trilineage hematopoiesis and mild dyspoietic changes.  No increase in blastic cells.As per risk stratification of smoldering multiple myeloma she falls in the low risk group given that her free light chain ratio is less than 20, monoclonal protein is less than 2 g and less than 20% clonal plasma cells noted in the bone marrow. Median time to progression overt multiple myeloma would be about 110 months with 5% progression per year over the next 5 years.   Interval history-patient reports excessive daytime drowsiness and tends to fall asleep easily.  No changes in her appetite or weight.  She recently decreased the dose of methotrexate  after she was found to have mildly abnormal LFTs.  ECOG PS- 1 Pain scale- 0   Review of systems- Review of Systems  Constitutional:  Negative for chills, fever, malaise/fatigue and weight loss.  HENT:  Negative for congestion, ear discharge and nosebleeds.   Eyes:  Negative for blurred vision.  Respiratory:  Negative for cough, hemoptysis, sputum production, shortness of breath and wheezing.   Cardiovascular:  Negative for chest pain, palpitations, orthopnea and claudication.  Gastrointestinal:  Negative for abdominal pain, blood in stool, constipation, diarrhea, heartburn, melena, nausea and vomiting.  Genitourinary:  Negative for dysuria, flank pain, frequency, hematuria and urgency.  Musculoskeletal:  Negative for back pain, joint pain and myalgias.  Skin:  Negative for rash.  Neurological:  Negative for dizziness, tingling, focal weakness, seizures, weakness and headaches.  Endo/Heme/Allergies:  Does not bruise/bleed easily.  Psychiatric/Behavioral:  Negative for depression and suicidal ideas. The patient does not have insomnia.       No Known Allergies   Past Medical History:  Diagnosis Date   Anemia    Anxiety    Asthma    Headache    History of kidney stones    Kidney stone    Psoriatic arthritis (HCC)    Rheumatoid arthritis (HCC)    Smoldering multiple myeloma      Past Surgical History:  Procedure Laterality Date   ABDOMINAL HYSTERECTOMY     BREAST BIOPSY Left    negative 06/2012   CHOLECYSTECTOMY     CYSTOSCOPY/URETEROSCOPY/HOLMIUM LASER/STENT PLACEMENT Right 02/06/2020   Procedure: CYSTOSCOPY/URETEROSCOPY/HOLMIUM LASER/STENT PLACEMENT;  Surgeon: Francisca Redell BROCKS, MD;  Location: [facility] ORS;  Service: Urology;  Laterality: Right;    Social History   Socioeconomic History   Marital status: Married    Spouse name: Not on file   Number of children: Not on file   Years of education: Not on file   Highest education level: Not on file  Occupational History    Not on file  Tobacco Use   Smoking status: Never   Smokeless tobacco: Never  Vaping Use   Vaping status: Never Used  Substance and Sexual Activity   Alcohol use: Never    Comment: very rare.   Drug use: Never   Sexual activity: Yes    Birth control/protection: None  Other Topics Concern   Not on file  Social History Narrative   Not on file   Social Drivers of Health   Financial Resource Strain: Not on file  Food Insecurity: Not on file  Transportation Needs: Not on file  Physical Activity: Not on file  Stress: Not on file  Social Connections: Not on file  Intimate Partner Violence: Not on file    Family History  Problem Relation Age of Onset   Lung cancer Mother    Esophageal cancer Father    Heart attack Paternal Grandfather    Breast cancer Neg Hx      Current Outpatient Medications:    alendronate  (FOSAMAX ) 70 MG tablet, Take 1 tablet (70 mg total) by mouth once a week., Disp: 12 tablet, Rfl: 0   ALPRAZolam  (XANAX ) 0.25 MG tablet, Take 1 tablet (0.25 mg total) by mouth at bedtime as needed for anxiety., Disp: 30 tablet, Rfl: 0   azelastine  (ASTELIN ) 0.1 % nasal spray, Place 2 sprays into both nostrils at bedtime. Use in each nostril as directed, Disp: 30 mL, Rfl: 3   budesonide  (PULMICORT ) 180 MCG/ACT inhaler, Inhale 1 puff into the lungs 2 (two) times daily., Disp: 1 each, Rfl: 2   Calcium Carbonate-Vitamin D  (CALCIUM-VITAMIN D  PO), Take by mouth., Disp: , Rfl:    celecoxib  (CELEBREX ) 200 MG capsule, Take 1 capsule (200 mg total) by mouth daily., Disp: 90 capsule, Rfl: 0   cloNIDine  (CATAPRES ) 0.3 MG tablet, Take 1 tablet (0.3 mg total) by mouth daily., Disp: 90 tablet, Rfl: 3   fexofenadine  (ALLEGRA ) 180 MG tablet, Take 1 tablet (180 mg total) by mouth daily., Disp: 90 tablet, Rfl: 3   fluticasone  (FLONASE ) 50 MCG/ACT nasal spray, Place 1 spray into both nostrils daily., Disp: 16 g, Rfl: 0   folic acid  (FOLVITE ) 1 MG tablet, Take 1 mg by mouth daily., Disp: , Rfl:     gabapentin  (NEURONTIN ) 100 MG capsule, Take 1 capsule (100 mg total) by mouth daily., Disp: 90 capsule, Rfl: 0   methotrexate 2.5 MG tablet, SMARTSIG:5 Tablet(s) By Mouth Once a Week, Disp: , Rfl:    sertraline  (ZOLOFT ) 100 MG tablet, Take 1 tablet (100 mg total) by mouth daily., Disp: 90 tablet, Rfl: 3   SUMAtriptan  (IMITREX ) 50 MG tablet, Take 1 tablet (50 mg total) by mouth as needed for migraine (take one may repeat in 6 hours if needed)., Disp: 10 tablet, Rfl: 3   TALTZ 80 MG/ML pen, Inject 80 mg into the skin every 28 (twenty-eight) days., Disp: , Rfl:    tiZANidine  (ZANAFLEX ) 4 MG tablet, Take 1 tablet (4 mg total) by mouth daily. AM, Disp: 30 tablet, Rfl: 0   Vitamin D , Ergocalciferol , 50000 units CAPS, once a week, Disp: 12 capsule, Rfl: 0   amoxicillin -clavulanate (AUGMENTIN )  875-125 MG tablet, Take 1 tablet by mouth 2 (two) times daily. (Patient not taking: Reported on 02/09/2024), Disp: 14 tablet, Rfl: 0   COSENTYX SENSOREADY, 300 MG, 150 MG/ML SOAJ, Inject 2 Syringes into the skin every 28 (twenty-eight) days. (Patient not taking: Reported on 09/15/2023), Disp: , Rfl:   Physical exam:  Vitals:   02/09/24 1417  BP: 127/79  Pulse: 66  Resp: 18  Temp: 97.8 F (36.6 C)  TempSrc: Tympanic  SpO2: 100%  Weight: 123 lb 4.8 oz (55.9 kg)  Height: 5' 1 (1.549 m)   Physical Exam Cardiovascular:     Rate and Rhythm: Normal rate and regular rhythm.     Heart sounds: Normal heart sounds.  Pulmonary:     Effort: Pulmonary effort is normal.     Breath sounds: Normal breath sounds.  Abdominal:     General: Bowel sounds are normal.     Palpations: Abdomen is soft.  Skin:    General: Skin is warm and dry.  Neurological:     Mental Status: She is alert and oriented to person, place, and time.      I have personally reviewed labs listed below:    Latest Ref Rng & Units 01/15/2024    9:41 AM  CMP  Glucose 70 - 99 mg/dL 891   BUN 6 - 20 mg/dL 16   Creatinine 9.55 - 1.00 mg/dL  8.94   Sodium 864 - 854 mmol/L 135   Potassium 3.5 - 5.1 mmol/L 4.1   Chloride 98 - 111 mmol/L 102   CO2 22 - 32 mmol/L 26   Calcium 8.9 - 10.3 mg/dL 9.3   Total Protein 6.5 - 8.1 g/dL 7.5   Total Bilirubin 0.0 - 1.2 mg/dL 0.6   Alkaline Phos 38 - 126 U/L 56   AST 15 - 41 U/L 47   ALT 0 - 44 U/L 57       Latest Ref Rng & Units 01/15/2024    9:41 AM  CBC  WBC 4.0 - 10.5 K/uL 3.1   Hemoglobin 12.0 - 15.0 g/dL 88.8   Hematocrit 63.9 - 46.0 % 32.8   Platelets 150 - 400 K/uL 144     Assessment and plan- Patient is a 58 y.o. female with history of low risk smoldering multiple myeloma IgG lambda type here for a routine follow-up  Patient's hemoglobin has remained stable between 10.5-11.5.  Creatinine is within normal limits and serum calcium is normal.  M protein remains stable around 1 g.  Serum free lambda light chain has gradually increased from 150s a year ago presently to 212.  However in the absence of crab criteria patient does not require any repeat bone marrow biopsy at this time.  If there is continued and significant increase in her lambda light chains in the future we could consider repeating bone marrow biopsy and PET scan at that time.  I will see her back in 6 months with CBC with differential CMP myeloma panel and serum free light chains   Visit Diagnosis 1. Smoldering multiple myeloma      Dr. Annah Skene, MD, MPH [facility] [phone] 02/09/2024 2:20 PM

## 2024-02-09 NOTE — Addendum Note (Signed)
 Addended by: FAUSTINO BENCE on: 02/09/2024 02:56 PM   Modules accepted: Orders
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
## 2024-02-10 ENCOUNTER — Other Ambulatory Visit: Payer: Self-pay | Admitting: Nurse Practitioner

## 2024-02-12 ENCOUNTER — Other Ambulatory Visit: Payer: Self-pay | Admitting: Internal Medicine

## 2024-02-15 ENCOUNTER — Other Ambulatory Visit: Payer: Self-pay | Admitting: Family

## 2024-02-18 ENCOUNTER — Encounter: Payer: Self-pay | Admitting: Internal Medicine

## 2024-02-18 ENCOUNTER — Ambulatory Visit (INDEPENDENT_AMBULATORY_CARE_PROVIDER_SITE_OTHER): Admitting: Internal Medicine

## 2024-02-18 VITALS — BP 118/80 | HR 74 | Ht 61.0 in | Wt 122.8 lb

## 2024-02-18 DIAGNOSIS — J0100 Acute maxillary sinusitis, unspecified: Secondary | ICD-10-CM | POA: Diagnosis not present

## 2024-02-18 DIAGNOSIS — L4050 Arthropathic psoriasis, unspecified: Secondary | ICD-10-CM | POA: Diagnosis not present

## 2024-02-18 DIAGNOSIS — E782 Mixed hyperlipidemia: Secondary | ICD-10-CM

## 2024-02-18 DIAGNOSIS — Z0130 Encounter for examination of blood pressure without abnormal findings: Secondary | ICD-10-CM

## 2024-02-18 DIAGNOSIS — D472 Monoclonal gammopathy: Secondary | ICD-10-CM

## 2024-02-18 DIAGNOSIS — J301 Allergic rhinitis due to pollen: Secondary | ICD-10-CM

## 2024-02-18 MED ORDER — GABAPENTIN 100 MG PO CAPS
100.0000 mg | ORAL_CAPSULE | Freq: Every day | ORAL | 0 refills | Status: DC
Start: 2024-02-18 — End: 2024-03-03

## 2024-02-18 MED ORDER — AZITHROMYCIN 250 MG PO TABS: ORAL_TABLET | ORAL | 0 refills | Status: AC

## 2024-02-18 NOTE — Progress Notes (Signed)
 Established Patient Office Visit  Subjective:  Patient ID: Cassandra Carr, female    DOB: [birth year]  Age: 58 y.o. MRN: [id number]  Chief Complaint  Patient presents with   Acute Visit    Daytime drowsiness/falling asleep    Patient comes in with complaints of feeling tired and excessive daytime sleep.  Patient reports that she sleeps through the night quite deeply however she has been told that she snores.  She is concerned about obstructive sleep apnea and excessive daytime sleepiness.  But this change in her sleep pattern is relatively new and started about 3 weeks ago.  While discussing her medications, she seems to be taking her clonidine , Zoloft  regularly at night but occasionally takes Xanax  and Flexeril.  However she is supposed to be on gabapentin  100 mg/day but she ran out and decided to take half a tablet of her husbands prescription which was 850 mg and was taking it during daytime.  That can explain her excessive daytime sleepiness.  Patient advised to stop that completely.  Also to stay off her Xanax  and Flexeril.  Advised to continue taking her clonidine  at night and Zoloft  at night as well. If need be she will start back on gabapentin  at 100 mg at bedtime. If patient continues to have excessive daytime sleepiness, will schedule a PSG, and later on adding MSLT if needed. Patient is also complaining of sinus congestion, pressure, postnasal drip and greenish nasal discharge.  No fevers or chills and no cough.    No other concerns at this time.   Past Medical History:  Diagnosis Date   Anemia    Anxiety    Asthma    Headache    History of kidney stones    Kidney stone    Psoriatic arthritis (HCC)    Rheumatoid arthritis (HCC)    Smoldering multiple myeloma     Past Surgical History:  Procedure Laterality Date   ABDOMINAL HYSTERECTOMY     BREAST BIOPSY Left    negative 06/2012   CHOLECYSTECTOMY     CYSTOSCOPY/URETEROSCOPY/HOLMIUM LASER/STENT PLACEMENT Right  02/06/2020   Procedure: CYSTOSCOPY/URETEROSCOPY/HOLMIUM LASER/STENT PLACEMENT;  Surgeon: Francisca Redell BROCKS, MD;  Location: [facility] ORS;  Service: Urology;  Laterality: Right;    Social History   Socioeconomic History   Marital status: Married    Spouse name: Not on file   Number of children: Not on file   Years of education: Not on file   Highest education level: Not on file  Occupational History   Not on file  Tobacco Use   Smoking status: Never   Smokeless tobacco: Never  Vaping Use   Vaping status: Never Used  Substance and Sexual Activity   Alcohol use: Never    Comment: very rare.   Drug use: Never   Sexual activity: Yes    Birth control/protection: None  Other Topics Concern   Not on file  Social History Narrative   Not on file   Social Drivers of Health   Financial Resource Strain: Not on file  Food Insecurity: No Food Insecurity (02/09/2024)   Hunger Vital Sign    Worried About Running Out of Food in the Last Year: Never true    Ran Out of Food in the Last Year: Never true  Transportation Needs: No Transportation Needs (02/09/2024)   PRAPARE - Administrator, Civil Service (Medical): No    Lack of Transportation (Non-Medical): No  Physical Activity: Not on file  Stress: Not on file  Social Connections: Not on file  Intimate Partner Violence: Not At Risk (02/09/2024)   Humiliation, Afraid, Rape, and Kick questionnaire    Fear of Current or Ex-Partner: No    Emotionally Abused: No    Physically Abused: No    Sexually Abused: No    Family History  Problem Relation Age of Onset   Lung cancer Mother    Esophageal cancer Father    Heart attack Paternal Grandfather    Breast cancer Neg Hx     No Known Allergies  Outpatient Medications Prior to Visit  Medication Sig   alendronate  (FOSAMAX ) 70 MG tablet Take 1 tablet (70 mg total) by mouth once a week.   ALPRAZolam  (XANAX ) 0.25 MG tablet Take 1 tablet (0.25 mg total) by mouth at bedtime as needed for  anxiety.   azelastine  (ASTELIN ) 0.1 % nasal spray Place 2 sprays into both nostrils at bedtime. Use in each nostril as directed   Calcium Carbonate-Vitamin D  (CALCIUM-VITAMIN D  PO) Take by mouth.   celecoxib  (CELEBREX ) 200 MG capsule Take 1 capsule (200 mg total) by mouth daily.   cloNIDine  (CATAPRES ) 0.3 MG tablet Take 1 tablet (0.3 mg total) by mouth daily.   fexofenadine  (ALLEGRA ) 180 MG tablet Take 1 tablet (180 mg total) by mouth daily.   fluticasone  (FLONASE ) 50 MCG/ACT nasal spray Place 1 spray into both nostrils daily.   folic acid  (FOLVITE ) 1 MG tablet Take 1 mg by mouth daily.   methotrexate 2.5 MG tablet SMARTSIG:5 Tablet(s) By Mouth Once a Week   PULMICORT  FLEXHALER 180 MCG/ACT inhaler Inhale 1 puff into the lungs 2 (two) times daily.   sertraline  (ZOLOFT ) 100 MG tablet Take 1 tablet (100 mg total) by mouth daily.   SUMAtriptan  (IMITREX ) 50 MG tablet Take 1 tablet (50 mg total) by mouth as needed for migraine (take one may repeat in 6 hours if needed).   TALTZ 80 MG/ML pen Inject 80 mg into the skin every 28 (twenty-eight) days.   tiZANidine  (ZANAFLEX ) 4 MG tablet Take 1 tablet (4 mg total) by mouth daily. AM   Vitamin D , Ergocalciferol , 50000 units CAPS once a week   [DISCONTINUED] gabapentin  (NEURONTIN ) 100 MG capsule Take 1 capsule (100 mg total) by mouth daily.   [DISCONTINUED] amoxicillin -clavulanate (AUGMENTIN ) 875-125 MG tablet Take 1 tablet by mouth 2 (two) times daily. (Patient not taking: Reported on 02/18/2024)   [DISCONTINUED] COSENTYX SENSOREADY, 300 MG, 150 MG/ML SOAJ Inject 2 Syringes into the skin every 28 (twenty-eight) days. (Patient not taking: Reported on 09/15/2023)   No facility-administered medications prior to visit.    Review of Systems  Constitutional: Negative.  Negative for chills, fever and weight loss.  HENT: Negative.    Eyes: Negative.   Respiratory: Negative.  Negative for cough and shortness of breath.   Cardiovascular: Negative.  Negative for  chest pain, palpitations and leg swelling.  Gastrointestinal: Negative.  Negative for abdominal pain, constipation, diarrhea, heartburn, nausea and vomiting.  Genitourinary: Negative.  Negative for dysuria and flank pain.  Musculoskeletal: Negative.  Negative for joint pain and myalgias.  Skin: Negative.   Neurological: Negative.  Negative for dizziness, tingling, tremors, sensory change and headaches.  Endo/Heme/Allergies: Negative.   Psychiatric/Behavioral: Negative.  Negative for depression and suicidal ideas. The patient is not nervous/anxious.        Objective:   BP 118/80   Pulse 74   Ht 5' 1 (1.549 m)   Wt 122 lb 12.8 oz (55.7 kg)   SpO2 99%  BMI 23.20 kg/m   Vitals:   02/18/24 1155  BP: 118/80  Pulse: 74  Height: 5' 1 (1.549 m)  Weight: 122 lb 12.8 oz (55.7 kg)  SpO2: 99%  BMI (Calculated): 23.21    Physical Exam Vitals and nursing note reviewed.  Constitutional:      Appearance: Normal appearance.  HENT:     Head: Normocephalic and atraumatic.     Nose: Nose normal.     Mouth/Throat:     Mouth: Mucous membranes are moist.     Pharynx: Oropharynx is clear.  Eyes:     Conjunctiva/sclera: Conjunctivae normal.     Pupils: Pupils are equal, round, and reactive to light.  Cardiovascular:     Rate and Rhythm: Normal rate and regular rhythm.     Pulses: Normal pulses.     Heart sounds: Normal heart sounds. No murmur heard. Pulmonary:     Effort: Pulmonary effort is normal.     Breath sounds: Normal breath sounds. No wheezing.  Abdominal:     General: Bowel sounds are normal.     Palpations: Abdomen is soft.     Tenderness: There is no abdominal tenderness. There is no right CVA tenderness or left CVA tenderness.  Musculoskeletal:        General: Normal range of motion.     Cervical back: Normal range of motion.     Right lower leg: No edema.     Left lower leg: No edema.  Skin:    General: Skin is warm and dry.  Neurological:     General: No focal  deficit present.     Mental Status: She is alert and oriented to person, place, and time.  Psychiatric:        Mood and Affect: Mood normal.        Behavior: Behavior normal.      No results found for any visits on 02/18/24.  Recent Results (from the past 2160 hours)  Kappa/lambda light chains     Status: Abnormal   Collection Time: 01/15/24  9:41 AM  Result Value Ref Range   Kappa free light chain 15.8 3.3 - 19.4 mg/L   Lambda free light chains 212.3 (H) 5.7 - 26.3 mg/L   Kappa, lambda light chain ratio 0.07 (L) 0.26 - 1.65    Comment: (NOTE) Performed At: [facility] [address] [place], [place] [id number] Jennette Shorter MD Ey:[id number]   Multiple Myeloma Panel (SPEP&IFE w/QIG)     Status: Abnormal   Collection Time: 01/15/24  9:41 AM  Result Value Ref Range   IgG (Immunoglobin G), Serum 1,936 (H) 586 - 1,602 mg/dL   IgA 897 87 - 647 mg/dL   IgM (Immunoglobulin M), Srm 38 26 - 217 mg/dL   Total Protein ELP 7.1 6.0 - 8.5 g/dL   Albumin SerPl Elph-Mcnc 3.7 2.9 - 4.4 g/dL   Alpha 1 0.2 0.0 - 0.4 g/dL   Alpha2 Glob SerPl Elph-Mcnc 0.7 0.4 - 1.0 g/dL   B-Globulin SerPl Elph-Mcnc 0.9 0.7 - 1.3 g/dL   Gamma Glob SerPl Elph-Mcnc 1.5 0.4 - 1.8 g/dL   M Protein SerPl Elph-Mcnc 1.1 (H) Not Observed g/dL   Globulin, Total 3.4 2.2 - 3.9 g/dL   Albumin/Glob SerPl 1.1 0.7 - 1.7   IFE 1 Comment (A)     Comment: (NOTE) Immunofixation shows IgG monoclonal protein with lambda light chain specificity.    Please Note Comment     Comment: (NOTE) Protein electrophoresis scan will follow  via computer, mail, or courier delivery. Performed At: [facility] [address] [place], [place] [id number] Jennette Shorter MD Ey:[id number]   Comprehensive metabolic panel     Status: Abnormal   Collection Time: 01/15/24  9:41 AM  Result Value Ref Range   Sodium 135 135 - 145 mmol/L   Potassium 4.1 3.5 - 5.1 mmol/L   Chloride 102 98 - 111 mmol/L   CO2 26 22 - 32 mmol/L    Glucose, Bld 108 (H) 70 - 99 mg/dL    Comment: Glucose reference range applies only to samples taken after fasting for at least 8 hours.   BUN 16 6 - 20 mg/dL   Creatinine, Ser 8.94 (H) 0.44 - 1.00 mg/dL   Calcium 9.3 8.9 - 89.6 mg/dL   Total Protein 7.5 6.5 - 8.1 g/dL   Albumin 4.1 3.5 - 5.0 g/dL   AST 47 (H) 15 - 41 U/L   ALT 57 (H) 0 - 44 U/L   Alkaline Phosphatase 56 38 - 126 U/L   Total Bilirubin 0.6 0.0 - 1.2 mg/dL   GFR, Estimated >39 >39 mL/min    Comment: (NOTE) Calculated using the CKD-EPI Creatinine Equation (2021)    Anion gap 7 5 - 15    Comment: Performed at [facility], [address] [place], [place] [address]  CBC with Differential/Platelet     Status: Abnormal   Collection Time: 01/15/24  9:41 AM  Result Value Ref Range   WBC 3.1 (L) 4.0 - 10.5 K/uL   RBC 3.18 (L) 3.87 - 5.11 MIL/uL   Hemoglobin 11.1 (L) 12.0 - 15.0 g/dL   HCT 67.1 (L) 63.9 - 53.9 %   MCV 103.1 (H) 80.0 - 100.0 fL   MCH 34.9 (H) 26.0 - 34.0 pg   MCHC 33.8 30.0 - 36.0 g/dL   RDW 86.8 88.4 - 84.4 %   Platelets 144 (L) 150 - 400 K/uL   nRBC 0.0 0.0 - 0.2 %   Neutrophils Relative % 56 %   Neutro Abs 1.7 1.7 - 7.7 K/uL   Lymphocytes Relative 32 %   Lymphs Abs 1.0 0.7 - 4.0 K/uL   Monocytes Relative 7 %   Monocytes Absolute 0.2 0.1 - 1.0 K/uL   Eosinophils Relative 4 %   Eosinophils Absolute 0.1 0.0 - 0.5 K/uL   Basophils Relative 1 %   Basophils Absolute 0.0 0.0 - 0.1 K/uL   Immature Granulocytes 0 %   Abs Immature Granulocytes 0.01 0.00 - 0.07 K/uL    Comment: Performed at [facility], [address], [place], [place] [address]      Assessment & Plan:  Medications adjusted.  Z-Pak for sinus infection.  Monitor for daytime sleepiness. Problem List Items Addressed This Visit     Psoriatic arthritis (HCC)   Relevant Medications   gabapentin  (NEURONTIN ) 100 MG capsule   Mixed hyperlipidemia   MGUS (monoclonal gammopathy of unknown significance)   Other Visit Diagnoses        Acute non-recurrent maxillary sinusitis    -  Primary   Relevant Medications   azithromycin  (ZITHROMAX  Z-PAK) 250 MG tablet     Seasonal allergic rhinitis due to pollen           Return in about 2 weeks (around 03/03/2024).   Total time spent: 30 minutes  FERNAND FREDY RAMAN, MD  02/18/2024   This document may have been prepared by [facility] Voice Recognition software and as such may include unintentional dictation errors.

## 2024-03-03 ENCOUNTER — Ambulatory Visit (INDEPENDENT_AMBULATORY_CARE_PROVIDER_SITE_OTHER): Admitting: Internal Medicine

## 2024-03-03 ENCOUNTER — Encounter: Payer: Self-pay | Admitting: Internal Medicine

## 2024-03-03 VITALS — BP 116/78 | HR 82 | Ht 61.0 in | Wt 121.6 lb

## 2024-03-03 DIAGNOSIS — M5116 Intervertebral disc disorders with radiculopathy, lumbar region: Secondary | ICD-10-CM | POA: Diagnosis not present

## 2024-03-03 DIAGNOSIS — Z1231 Encounter for screening mammogram for malignant neoplasm of breast: Secondary | ICD-10-CM | POA: Insufficient documentation

## 2024-03-03 DIAGNOSIS — D472 Monoclonal gammopathy: Secondary | ICD-10-CM

## 2024-03-03 DIAGNOSIS — L4050 Arthropathic psoriasis, unspecified: Secondary | ICD-10-CM

## 2024-03-03 DIAGNOSIS — F411 Generalized anxiety disorder: Secondary | ICD-10-CM

## 2024-03-03 DIAGNOSIS — Z0130 Encounter for examination of blood pressure without abnormal findings: Secondary | ICD-10-CM

## 2024-03-03 DIAGNOSIS — E782 Mixed hyperlipidemia: Secondary | ICD-10-CM | POA: Diagnosis not present

## 2024-03-03 MED ORDER — GABAPENTIN 100 MG PO CAPS: 100.0000 mg | ORAL_CAPSULE | Freq: Two times a day (BID) | ORAL | 6 refills | Status: AC

## 2024-03-03 NOTE — Progress Notes (Signed)
 Established Patient Office Visit  Subjective:  Patient ID: Cassandra Carr, female    DOB: 06-11-1966  Age: 58 y.o. MRN: [id number]  Chief Complaint  Patient presents with   Follow-up    2 week follow up    Patient is seen today for follow up visit. She was last reporting excessive daytime sleepiness and sinusitis. She states she feels well and her symptoms from sinusitis resolved as well as the daytime sleepiness. She had been taking 425 mg of gabapentin  from her husbands prescription when she ran out of her 100 mg Gabapentin . Since restarting her 100 mg Gabapentin  she reports excessive sleepiness has resolved but she is still having pain at that dose. Will increase Gabapentin  to 100 mg twice a day. She is due for her mammogram, which was ordered earlier this year; Will provide patient with phone number to call and schedule an appointment. Patient denies any complaints at this time.    No other concerns at this time.   Past Medical History:  Diagnosis Date   Anemia    Anxiety    Asthma    Headache    History of kidney stones    Kidney stone    Psoriatic arthritis (HCC)    Rheumatoid arthritis (HCC)    Smoldering multiple myeloma     Past Surgical History:  Procedure Laterality Date   ABDOMINAL HYSTERECTOMY     BREAST BIOPSY Left    negative 06/2012   CHOLECYSTECTOMY     CYSTOSCOPY/URETEROSCOPY/HOLMIUM LASER/STENT PLACEMENT Right 02/06/2020   Procedure: CYSTOSCOPY/URETEROSCOPY/HOLMIUM LASER/STENT PLACEMENT;  Surgeon: Francisca Redell BROCKS, MD;  Location: [facility] ORS;  Service: Urology;  Laterality: Right;    Social History   Socioeconomic History   Marital status: Married    Spouse name: Not on file   Number of children: Not on file   Years of education: Not on file   Highest education level: Not on file  Occupational History   Not on file  Tobacco Use   Smoking status: Never   Smokeless tobacco: Never  Vaping Use   Vaping status: Never Used  Substance and Sexual  Activity   Alcohol use: Never    Comment: very rare.   Drug use: Never   Sexual activity: Yes    Birth control/protection: None  Other Topics Concern   Not on file  Social History Narrative   Not on file   Social Drivers of Health   Financial Resource Strain: Not on file  Food Insecurity: No Food Insecurity (02/09/2024)   Hunger Vital Sign    Worried About Running Out of Food in the Last Year: Never true    Ran Out of Food in the Last Year: Never true  Transportation Needs: No Transportation Needs (02/09/2024)   PRAPARE - Administrator, Civil Service (Medical): No    Lack of Transportation (Non-Medical): No  Physical Activity: Not on file  Stress: Not on file  Social Connections: Not on file  Intimate Partner Violence: Not At Risk (02/09/2024)   Humiliation, Afraid, Rape, and Kick questionnaire    Fear of Current or Ex-Partner: No    Emotionally Abused: No    Physically Abused: No    Sexually Abused: No    Family History  Problem Relation Age of Onset   Lung cancer Mother    Esophageal cancer Father    Heart attack Paternal Grandfather    Breast cancer Neg Hx     No Known Allergies  Outpatient Medications Prior  to Visit  Medication Sig   alendronate  (FOSAMAX ) 70 MG tablet Take 1 tablet (70 mg total) by mouth once a week.   ALPRAZolam  (XANAX ) 0.25 MG tablet Take 1 tablet (0.25 mg total) by mouth at bedtime as needed for anxiety.   azelastine  (ASTELIN ) 0.1 % nasal spray Place 2 sprays into both nostrils at bedtime. Use in each nostril as directed   Calcium Carbonate-Vitamin D  (CALCIUM-VITAMIN D  PO) Take by mouth.   celecoxib  (CELEBREX ) 200 MG capsule Take 1 capsule (200 mg total) by mouth daily.   cloNIDine  (CATAPRES ) 0.3 MG tablet Take 1 tablet (0.3 mg total) by mouth daily.   fexofenadine  (ALLEGRA ) 180 MG tablet Take 1 tablet (180 mg total) by mouth daily.   fluticasone  (FLONASE ) 50 MCG/ACT nasal spray Place 1 spray into both nostrils daily.   folic acid   (FOLVITE ) 1 MG tablet Take 1 mg by mouth daily.   methotrexate 2.5 MG tablet SMARTSIG:5 Tablet(s) By Mouth Once a Week   PULMICORT  FLEXHALER 180 MCG/ACT inhaler Inhale 1 puff into the lungs 2 (two) times daily.   sertraline  (ZOLOFT ) 100 MG tablet Take 1 tablet (100 mg total) by mouth daily.   SUMAtriptan  (IMITREX ) 50 MG tablet Take 1 tablet (50 mg total) by mouth as needed for migraine (take one may repeat in 6 hours if needed).   TALTZ 80 MG/ML pen Inject 80 mg into the skin every 28 (twenty-eight) days.   tiZANidine  (ZANAFLEX ) 4 MG tablet Take 1 tablet (4 mg total) by mouth daily. AM   Vitamin D , Ergocalciferol , 50000 units CAPS once a week   [DISCONTINUED] gabapentin  (NEURONTIN ) 100 MG capsule Take 1 capsule (100 mg total) by mouth daily.   No facility-administered medications prior to visit.    Review of Systems  Constitutional: Negative.  Negative for chills, fever and weight loss.  HENT: Negative.    Eyes: Negative.   Respiratory: Negative.  Negative for cough and shortness of breath.   Cardiovascular: Negative.  Negative for chest pain, palpitations and leg swelling.  Gastrointestinal: Negative.  Negative for abdominal pain, constipation, diarrhea, heartburn, nausea and vomiting.  Genitourinary: Negative.  Negative for dysuria and flank pain.  Musculoskeletal: Negative.  Negative for joint pain and myalgias.  Skin: Negative.   Neurological: Negative.  Negative for dizziness, tingling, tremors and headaches.  Endo/Heme/Allergies: Negative.   Psychiatric/Behavioral: Negative.  Negative for depression and suicidal ideas. The patient is not nervous/anxious.        Objective:   BP 116/78   Pulse 82   Ht 5' 1 (1.549 m)   Wt 121 lb 9.6 oz (55.2 kg)   SpO2 99%   BMI 22.98 kg/m   Vitals:   03/03/24 1312  BP: 116/78  Pulse: 82  Height: 5' 1 (1.549 m)  Weight: 121 lb 9.6 oz (55.2 kg)  SpO2: 99%  BMI (Calculated): 22.99    Physical Exam   No results found for any  visits on 03/03/24.  Recent Results (from the past 2160 hours)  Kappa/lambda light chains     Status: Abnormal   Collection Time: 01/15/24  9:41 AM  Result Value Ref Range   Kappa free light chain 15.8 3.3 - 19.4 mg/L   Lambda free light chains 212.3 (H) 5.7 - 26.3 mg/L   Kappa, lambda light chain ratio 0.07 (L) 0.26 - 1.65    Comment: (NOTE) Performed At: [facility] [address] [place], [place] [id number] Jennette Shorter MD Ey:[id number]   Multiple Myeloma Panel (SPEP&IFE w/QIG)  Status: Abnormal   Collection Time: 01/15/24  9:41 AM  Result Value Ref Range   IgG (Immunoglobin G), Serum 1,936 (H) 586 - 1,602 mg/dL   IgA 897 87 - 647 mg/dL   IgM (Immunoglobulin M), Srm 38 26 - 217 mg/dL   Total Protein ELP 7.1 6.0 - 8.5 g/dL   Albumin SerPl Elph-Mcnc 3.7 2.9 - 4.4 g/dL   Alpha 1 0.2 0.0 - 0.4 g/dL   Alpha2 Glob SerPl Elph-Mcnc 0.7 0.4 - 1.0 g/dL   B-Globulin SerPl Elph-Mcnc 0.9 0.7 - 1.3 g/dL   Gamma Glob SerPl Elph-Mcnc 1.5 0.4 - 1.8 g/dL   M Protein SerPl Elph-Mcnc 1.1 (H) Not Observed g/dL   Globulin, Total 3.4 2.2 - 3.9 g/dL   Albumin/Glob SerPl 1.1 0.7 - 1.7   IFE 1 Comment (A)     Comment: (NOTE) Immunofixation shows IgG monoclonal protein with lambda light chain specificity.    Please Note Comment     Comment: (NOTE) Protein electrophoresis scan will follow via computer, mail, or courier delivery. Performed At: [facility] [address] [place], [place] [id number] Jennette Shorter MD Ey:[id number]   Comprehensive metabolic panel     Status: Abnormal   Collection Time: 01/15/24  9:41 AM  Result Value Ref Range   Sodium 135 135 - 145 mmol/L   Potassium 4.1 3.5 - 5.1 mmol/L   Chloride 102 98 - 111 mmol/L   CO2 26 22 - 32 mmol/L   Glucose, Bld 108 (H) 70 - 99 mg/dL    Comment: Glucose reference range applies only to samples taken after fasting for at least 8 hours.   BUN 16 6 - 20 mg/dL   Creatinine, Ser 8.94 (H) 0.44 - 1.00 mg/dL    Calcium 9.3 8.9 - 89.6 mg/dL   Total Protein 7.5 6.5 - 8.1 g/dL   Albumin 4.1 3.5 - 5.0 g/dL   AST 47 (H) 15 - 41 U/L   ALT 57 (H) 0 - 44 U/L   Alkaline Phosphatase 56 38 - 126 U/L   Total Bilirubin 0.6 0.0 - 1.2 mg/dL   GFR, Estimated >39 >39 mL/min    Comment: (NOTE) Calculated using the CKD-EPI Creatinine Equation (2021)    Anion gap 7 5 - 15    Comment: Performed at [facility], [address] [place], [place] [address]  CBC with Differential/Platelet     Status: Abnormal   Collection Time: 01/15/24  9:41 AM  Result Value Ref Range   WBC 3.1 (L) 4.0 - 10.5 K/uL   RBC 3.18 (L) 3.87 - 5.11 MIL/uL   Hemoglobin 11.1 (L) 12.0 - 15.0 g/dL   HCT 67.1 (L) 63.9 - 53.9 %   MCV 103.1 (H) 80.0 - 100.0 fL   MCH 34.9 (H) 26.0 - 34.0 pg   MCHC 33.8 30.0 - 36.0 g/dL   RDW 86.8 88.4 - 84.4 %   Platelets 144 (L) 150 - 400 K/uL   nRBC 0.0 0.0 - 0.2 %   Neutrophils Relative % 56 %   Neutro Abs 1.7 1.7 - 7.7 K/uL   Lymphocytes Relative 32 %   Lymphs Abs 1.0 0.7 - 4.0 K/uL   Monocytes Relative 7 %   Monocytes Absolute 0.2 0.1 - 1.0 K/uL   Eosinophils Relative 4 %   Eosinophils Absolute 0.1 0.0 - 0.5 K/uL   Basophils Relative 1 %   Basophils Absolute 0.0 0.0 - 0.1 K/uL   Immature Granulocytes 0 %   Abs Immature  Granulocytes 0.01 0.00 - 0.07 K/uL    Comment: Performed at [facility], [address] [place], [place] [address]      Assessment & Plan:  Increase Gabapentin  to 100 mg twice a day. Continue other medications as prescribed. Patient will call to schedule her mammogram. Will check routine labs in 3 months at next follow up appointment. Problem List Items Addressed This Visit     Lumbar disc herniation with radiculopathy   Relevant Medications   gabapentin  (NEURONTIN ) 100 MG capsule   GAD (generalized anxiety disorder)   Mixed hyperlipidemia - Primary   Breast cancer screening by mammogram    Return in about 3 months (around 06/03/2024).   Total time spent:  20 minutes  FERNAND FREDY RAMAN, MD  03/03/2024   This document may have been prepared by [facility] Voice Recognition software and as such may include unintentional dictation errors.

## 2024-03-13 ENCOUNTER — Other Ambulatory Visit: Payer: Self-pay | Admitting: Internal Medicine

## 2024-03-13 DIAGNOSIS — J301 Allergic rhinitis due to pollen: Secondary | ICD-10-CM

## 2024-03-17 ENCOUNTER — Ambulatory Visit: Admitting: Internal Medicine

## 2024-04-10 ENCOUNTER — Other Ambulatory Visit: Payer: Self-pay | Admitting: Internal Medicine

## 2024-04-10 DIAGNOSIS — J301 Allergic rhinitis due to pollen: Secondary | ICD-10-CM

## 2024-04-14 ENCOUNTER — Other Ambulatory Visit: Payer: Self-pay | Admitting: Cardiology

## 2024-04-14 DIAGNOSIS — M85851 Other specified disorders of bone density and structure, right thigh: Secondary | ICD-10-CM

## 2024-05-06 ENCOUNTER — Other Ambulatory Visit: Payer: Self-pay | Admitting: Family

## 2024-05-23 ENCOUNTER — Other Ambulatory Visit: Payer: Self-pay | Admitting: Internal Medicine

## 2024-05-23 ENCOUNTER — Encounter

## 2024-05-23 DIAGNOSIS — L4050 Arthropathic psoriasis, unspecified: Secondary | ICD-10-CM

## 2024-06-06 ENCOUNTER — Ambulatory Visit: Admitting: Internal Medicine

## 2024-06-06 ENCOUNTER — Encounter: Payer: Self-pay | Admitting: Internal Medicine

## 2024-06-06 VITALS — BP 112/84 | HR 94 | Ht 61.0 in | Wt 128.6 lb

## 2024-06-06 DIAGNOSIS — D472 Monoclonal gammopathy: Secondary | ICD-10-CM | POA: Insufficient documentation

## 2024-06-06 DIAGNOSIS — E782 Mixed hyperlipidemia: Secondary | ICD-10-CM

## 2024-06-06 DIAGNOSIS — J301 Allergic rhinitis due to pollen: Secondary | ICD-10-CM

## 2024-06-06 DIAGNOSIS — L4050 Arthropathic psoriasis, unspecified: Secondary | ICD-10-CM

## 2024-06-06 DIAGNOSIS — Z0130 Encounter for examination of blood pressure without abnormal findings: Secondary | ICD-10-CM

## 2024-06-06 DIAGNOSIS — M810 Age-related osteoporosis without current pathological fracture: Secondary | ICD-10-CM | POA: Insufficient documentation

## 2024-06-06 DIAGNOSIS — M85851 Other specified disorders of bone density and structure, right thigh: Secondary | ICD-10-CM

## 2024-06-06 DIAGNOSIS — E559 Vitamin D deficiency, unspecified: Secondary | ICD-10-CM | POA: Diagnosis not present

## 2024-06-06 DIAGNOSIS — E538 Deficiency of other specified B group vitamins: Secondary | ICD-10-CM

## 2024-06-06 MED ORDER — ALENDRONATE SODIUM 70 MG PO TABS: 70.0000 mg | ORAL_TABLET | ORAL | 0 refills | Status: DC

## 2024-06-06 MED ORDER — FLUTICASONE PROPIONATE 50 MCG/ACT NA SUSP: 1.0000 | Freq: Every day | NASAL | 0 refills | Status: DC

## 2024-06-06 MED ORDER — PULMICORT FLEXHALER 180 MCG/ACT IN AEPB: 1.0000 | INHALATION_SPRAY | Freq: Two times a day (BID) | RESPIRATORY_TRACT | 0 refills | Status: DC

## 2024-06-06 NOTE — Progress Notes (Signed)
 Established Patient Office Visit  Subjective:  Patient ID: LAKE BREEDING, female    DOB: 10/26/1965  Age: 58 y.o. MRN: [id number]  Chief Complaint  Patient presents with   Follow-up    3 month follow up    Patient is here today for follow up. She reports doing fairly well but has been having increased pain since switching to Rinvoq a month ago. She reports only doing gabapentin  only once a day but states she thinks she needs to take it twice a day as Rinvoq has not been helping her as much as she thought it would. She is scheduled to see Rheumatologist in a few weeks. Recommend patient to contact them and let them know how she is feeling. Recommend patient to increase Gabapentin  to 100 mg BID as previously prescribed until she sees her Rheumatologist to discuss her medications.  Patient reports her Mammogram is rescheduled for 07/15/24. Routine labs are due today.  Patient declines wanting a flu shot today.     No other concerns at this time.   Past Medical History:  Diagnosis Date   Anemia    Anxiety    Asthma    Headache    History of kidney stones    Kidney stone    Psoriatic arthritis (HCC)    Rheumatoid arthritis (HCC)    Smoldering multiple myeloma     Past Surgical History:  Procedure Laterality Date   ABDOMINAL HYSTERECTOMY     BREAST BIOPSY Left    negative 06/2012   CHOLECYSTECTOMY     CYSTOSCOPY/URETEROSCOPY/HOLMIUM LASER/STENT PLACEMENT Right 02/06/2020   Procedure: CYSTOSCOPY/URETEROSCOPY/HOLMIUM LASER/STENT PLACEMENT;  Surgeon: Francisca Redell BROCKS, MD;  Location: [facility];  Service: Urology;  Laterality: Right;    Social History   Socioeconomic History   Marital status: Married    Spouse name: Not on file   Number of children: Not on file   Years of education: Not on file   Highest education level: Not on file  Occupational History   Not on file  Tobacco Use   Smoking status: Never   Smokeless tobacco: Never  Vaping Use   Vaping status: Never  Used  Substance and Sexual Activity   Alcohol use: Never    Comment: very rare.   Drug use: Never   Sexual activity: Yes    Birth control/protection: None  Other Topics Concern   Not on file  Social History Narrative   Not on file   Social Drivers of Health   Financial Resource Strain: Not on file  Food Insecurity: No Food Insecurity (02/09/2024)   Hunger Vital Sign    Worried About Running Out of Food in the Last Year: Never true    Ran Out of Food in the Last Year: Never true  Transportation Needs: No Transportation Needs (02/09/2024)   PRAPARE - Administrator, Civil Service (Medical): No    Lack of Transportation (Non-Medical): No  Physical Activity: Not on file  Stress: Not on file  Social Connections: Not on file  Intimate Partner Violence: Not At Risk (02/09/2024)   Humiliation, Afraid, Rape, and Kick questionnaire    Fear of Current or Ex-Partner: No    Emotionally Abused: No    Physically Abused: No    Sexually Abused: No    Family History  Problem Relation Age of Onset   Lung cancer Mother    Esophageal cancer Father    Heart attack Paternal Grandfather    Breast cancer Neg Hx  No Known Allergies  Outpatient Medications Prior to Visit  Medication Sig   ALPRAZolam  (XANAX ) 0.25 MG tablet Take 1 tablet (0.25 mg total) by mouth at bedtime as needed for anxiety.   azelastine  (ASTELIN ) 0.1 % nasal spray Place 2 sprays into both nostrils at bedtime. Use in each nostril as directed   Calcium Carbonate-Vitamin D  (CALCIUM-VITAMIN D  PO) Take by mouth.   celecoxib  (CELEBREX ) 200 MG capsule Take 1 capsule (200 mg total) by mouth daily.   cloNIDine  (CATAPRES ) 0.3 MG tablet Take 1 tablet (0.3 mg total) by mouth daily.   fexofenadine  (ALLEGRA ) 180 MG tablet Take 1 tablet (180 mg total) by mouth daily.   folic acid  (FOLVITE ) 1 MG tablet Take 1 mg by mouth daily.   gabapentin  (NEURONTIN ) 100 MG capsule Take 1 capsule (100 mg total) by mouth 2 (two) times daily.    methotrexate 2.5 MG tablet SMARTSIG:5 Tablet(s) By Mouth Once a Week   RINVOQ 15 MG TB24 Take 1 tablet by mouth daily.   sertraline  (ZOLOFT ) 100 MG tablet Take 1 tablet (100 mg total) by mouth daily.   SUMAtriptan  (IMITREX ) 50 MG tablet Take 1 tablet (50 mg total) by mouth as needed for migraine (take one may repeat in 6 hours if needed).   Vitamin D , Ergocalciferol , 50000 units CAPS once a week   [DISCONTINUED] alendronate  (FOSAMAX ) 70 MG tablet Take 1 tablet (70 mg total) by mouth once a week.   [DISCONTINUED] fluticasone  (FLONASE ) 50 MCG/ACT nasal spray Place 1 spray into both nostrils daily.   [DISCONTINUED] PULMICORT  FLEXHALER 180 MCG/ACT inhaler Inhale 1 puff into the lungs 2 (two) times daily.   [DISCONTINUED] tiZANidine  (ZANAFLEX ) 4 MG tablet Take 1 tablet (4 mg total) by mouth daily. AM   [DISCONTINUED] TALTZ 80 MG/ML pen Inject 80 mg into the skin every 28 (twenty-eight) days. (Patient not taking: Reported on 06/06/2024)   No facility-administered medications prior to visit.    Review of Systems  Constitutional: Negative.  Negative for chills, fever and malaise/fatigue.  HENT: Negative.  Negative for congestion and sore throat.   Eyes: Negative.  Negative for blurred vision and pain.  Respiratory: Negative.  Negative for cough and shortness of breath.   Cardiovascular: Negative.  Negative for chest pain, palpitations and leg swelling.  Gastrointestinal: Negative.  Negative for abdominal pain, blood in stool, constipation, diarrhea, heartburn, melena, nausea and vomiting.  Genitourinary: Negative.  Negative for dysuria, flank pain, frequency and urgency.  Musculoskeletal:  Positive for joint pain. Negative for myalgias.  Skin: Negative.   Neurological: Negative.  Negative for dizziness, tingling, sensory change, weakness and headaches.  Endo/Heme/Allergies: Negative.   Psychiatric/Behavioral: Negative.  Negative for depression and suicidal ideas. The patient is not nervous/anxious.         Objective:   BP 112/84   Pulse 94   Ht 5' 1 (1.549 m)   Wt 128 lb 9.6 oz (58.3 kg)   SpO2 97%   BMI 24.30 kg/m   Vitals:   06/06/24 1315  BP: 112/84  Pulse: 94  Height: 5' 1 (1.549 m)  Weight: 128 lb 9.6 oz (58.3 kg)  SpO2: 97%  BMI (Calculated): 24.31    Physical Exam Vitals and nursing note reviewed.  Constitutional:      Appearance: Normal appearance.  HENT:     Head: Normocephalic and atraumatic.     Nose: Nose normal.     Mouth/Throat:     Mouth: Mucous membranes are moist.     Pharynx: Oropharynx  is clear.  Eyes:     Conjunctiva/sclera: Conjunctivae normal.     Pupils: Pupils are equal, round, and reactive to light.  Cardiovascular:     Rate and Rhythm: Normal rate and regular rhythm.     Pulses: Normal pulses.     Heart sounds: Normal heart sounds. No murmur heard. Pulmonary:     Effort: Pulmonary effort is normal.     Breath sounds: Normal breath sounds. No wheezing.  Abdominal:     General: Bowel sounds are normal.     Palpations: Abdomen is soft.     Tenderness: There is no abdominal tenderness. There is no right CVA tenderness or left CVA tenderness.  Musculoskeletal:        General: Normal range of motion.     Cervical back: Normal range of motion.     Right lower leg: No edema.     Left lower leg: No edema.  Skin:    General: Skin is warm and dry.  Neurological:     General: No focal deficit present.     Mental Status: She is alert and oriented to person, place, and time.  Psychiatric:        Mood and Affect: Mood normal.        Behavior: Behavior normal.      No results found for any visits on 06/06/24.  No results found for this or any previous visit (from the past 2160 hours).    Assessment & Plan:  Refills sent. Check routine labs today and FU with patient on results. Increase gabapentin  to twice daily as prescribed. Recommend patient call Rheumatologist about her pain and Rinvoq. Continue taking other medications as  prescribed. Problem List Items Addressed This Visit     Psoriatic arthritis (HCC)   Relevant Orders   CMP14+EGFR   CBC with Diff   Mixed hyperlipidemia - Primary   Relevant Orders   Lipid panel   CMP14+EGFR   CBC with Diff   Vitamin D  deficiency   Relevant Orders   VITAMIN D  25 Hydroxy (Vit-D Deficiency, Fractures)   Vitamin B12 deficiency disease   Relevant Orders   Vitamin B12   Osteopenia of right hip   Relevant Medications   alendronate  (FOSAMAX ) 70 MG tablet   Non-seasonal allergic rhinitis due to pollen   Relevant Medications   fluticasone  (FLONASE ) 50 MCG/ACT nasal spray   budesonide  (PULMICORT  FLEXHALER) 180 MCG/ACT inhaler   Smoldering multiple myeloma   Relevant Orders   CBC with Diff   Age-related osteoporosis without current pathological fracture   Relevant Medications   alendronate  (FOSAMAX ) 70 MG tablet    Return in about 4 months (around 10/05/2024).   Total time spent: 25 minutes. This time includes review of previous notes and results and patient face to face interaction during today's visit.    FERNAND FREDY RAMAN, MD  06/06/2024   This document may have been prepared by [facility] Voice Recognition software and as such may include unintentional dictation errors.

## 2024-06-07 ENCOUNTER — Ambulatory Visit: Payer: Self-pay | Admitting: Internal Medicine

## 2024-06-07 DIAGNOSIS — E782 Mixed hyperlipidemia: Secondary | ICD-10-CM

## 2024-06-07 LAB — LIPID PANEL
Chol/HDL Ratio: 3.2 ratio (ref 0.0–4.4)
Cholesterol, Total: 208 mg/dL — ABNORMAL HIGH (ref 100–199)
HDL: 65 mg/dL (ref >39)
LDL Chol Calc (NIH): 121 mg/dL — ABNORMAL HIGH (ref 0–99)
Triglycerides: 124 mg/dL (ref 0–149)
VLDL Cholesterol Cal: 22 mg/dL (ref 5–40)

## 2024-06-07 LAB — CMP14+EGFR
ALT: 39 IU/L — ABNORMAL HIGH (ref 0–32)
AST: 42 IU/L — ABNORMAL HIGH (ref 0–40)
Albumin: 4.5 g/dL (ref 3.8–4.9)
Alkaline Phosphatase: 68 IU/L (ref 49–135)
BUN/Creatinine Ratio: 20 (ref 12–28)
BUN: 16 mg/dL (ref 8–27)
Bilirubin Total: 0.3 mg/dL (ref 0.0–1.2)
CO2: 25 mmol/L (ref 20–29)
Calcium: 9.7 mg/dL (ref 8.7–10.3)
Chloride: 101 mmol/L (ref 96–106)
Creatinine, Ser: 0.81 mg/dL (ref 0.57–1.00)
Globulin, Total: 3.1 g/dL (ref 1.5–4.5)
Glucose: 77 mg/dL (ref 70–99)
Potassium: 4.4 mmol/L (ref 3.5–5.2)
Sodium: 138 mmol/L (ref 134–144)
Total Protein: 7.6 g/dL (ref 6.0–8.5)
eGFR: 83 mL/min/1.73 (ref >59)

## 2024-06-07 LAB — CBC WITH DIFFERENTIAL/PLATELET
Basophils Absolute: 0 x10E3/uL (ref 0.0–0.2)
Basos: 1 %
EOS (ABSOLUTE): 0.1 x10E3/uL (ref 0.0–0.4)
Eos: 1 %
Hematocrit: 34.8 % (ref 34.0–46.6)
Hemoglobin: 11.4 g/dL (ref 11.1–15.9)
Immature Grans (Abs): 0 x10E3/uL (ref 0.0–0.1)
Immature Granulocytes: 0 %
Lymphocytes Absolute: 2 x10E3/uL (ref 0.7–3.1)
Lymphs: 50 %
MCH: 34 pg — ABNORMAL HIGH (ref 26.6–33.0)
MCHC: 32.8 g/dL (ref 31.5–35.7)
MCV: 104 fL — ABNORMAL HIGH (ref 79–97)
Monocytes Absolute: 0.4 x10E3/uL (ref 0.1–0.9)
Monocytes: 10 %
Neutrophils Absolute: 1.5 x10E3/uL (ref 1.4–7.0)
Neutrophils: 37 %
Platelets: 165 x10E3/uL (ref 150–450)
RBC: 3.35 x10E6/uL — ABNORMAL LOW (ref 3.77–5.28)
RDW: 13.8 % (ref 11.7–15.4)
WBC: 4 x10E3/uL (ref 3.4–10.8)

## 2024-06-07 LAB — VITAMIN D 25 HYDROXY (VIT D DEFICIENCY, FRACTURES): Vit D, 25-Hydroxy: 68.8 ng/mL (ref 30.0–100.0)

## 2024-06-07 LAB — VITAMIN B12: Vitamin B-12: 559 pg/mL (ref 232–1245)

## 2024-06-07 MED ORDER — ROSUVASTATIN CALCIUM 10 MG PO TABS: 10.0000 mg | ORAL_TABLET | Freq: Every day | ORAL | 3 refills | Status: AC

## 2024-06-09 NOTE — Progress Notes (Signed)
 Patient notified

## 2024-07-09 ENCOUNTER — Other Ambulatory Visit: Payer: Self-pay | Admitting: Medical Genetics

## 2024-07-15 ENCOUNTER — Ambulatory Visit
Admission: RE | Admit: 2024-07-15 | Discharge: 2024-07-15 | Disposition: A | Source: Ambulatory Visit | Attending: Internal Medicine | Admitting: Internal Medicine

## 2024-07-15 DIAGNOSIS — Z1231 Encounter for screening mammogram for malignant neoplasm of breast: Secondary | ICD-10-CM | POA: Diagnosis present

## 2024-07-19 ENCOUNTER — Ambulatory Visit: Payer: Self-pay | Admitting: Internal Medicine

## 2024-07-21 ENCOUNTER — Telehealth: Payer: Self-pay | Admitting: Internal Medicine

## 2024-07-21 ENCOUNTER — Other Ambulatory Visit: Payer: Self-pay | Admitting: Internal Medicine

## 2024-07-21 MED ORDER — AMOXICILLIN-POT CLAVULANATE 875-125 MG PO TABS: 1.0000 | ORAL_TABLET | Freq: Two times a day (BID) | ORAL | 0 refills | Status: AC

## 2024-07-21 NOTE — Telephone Encounter (Signed)
 Patient left VM requesting an antibiotic be sent in for her. States she's having a cough that is producing green mucus for 2 weeks.

## 2024-07-25 ENCOUNTER — Other Ambulatory Visit: Payer: Self-pay | Admitting: Internal Medicine

## 2024-07-25 DIAGNOSIS — M85851 Other specified disorders of bone density and structure, right thigh: Secondary | ICD-10-CM

## 2024-07-25 DIAGNOSIS — M810 Age-related osteoporosis without current pathological fracture: Secondary | ICD-10-CM

## 2024-08-02 ENCOUNTER — Other Ambulatory Visit: Payer: Self-pay | Admitting: Internal Medicine

## 2024-08-02 DIAGNOSIS — J301 Allergic rhinitis due to pollen: Secondary | ICD-10-CM

## 2024-08-06 ENCOUNTER — Other Ambulatory Visit: Payer: Self-pay | Admitting: Internal Medicine

## 2024-08-06 DIAGNOSIS — J301 Allergic rhinitis due to pollen: Secondary | ICD-10-CM

## 2024-08-11 ENCOUNTER — Other Ambulatory Visit

## 2024-08-11 DIAGNOSIS — D472 Monoclonal gammopathy: Secondary | ICD-10-CM

## 2024-08-11 LAB — CMP (CANCER CENTER ONLY)
ALT: 23 U/L (ref 0–44)
AST: 34 U/L (ref 15–41)
Albumin: 4.3 g/dL (ref 3.5–5.0)
Alkaline Phosphatase: 102 U/L (ref 38–126)
Anion gap: 10 (ref 5–15)
BUN: 19 mg/dL (ref 6–20)
CO2: 26 mmol/L (ref 22–32)
Calcium: 9.9 mg/dL (ref 8.9–10.3)
Chloride: 101 mmol/L (ref 98–111)
Creatinine: 1.06 mg/dL — ABNORMAL HIGH (ref 0.44–1.00)
GFR, Estimated: 60 mL/min — ABNORMAL LOW
Glucose, Bld: 93 mg/dL (ref 70–99)
Potassium: 4.7 mmol/L (ref 3.5–5.1)
Sodium: 138 mmol/L (ref 135–145)
Total Bilirubin: 0.5 mg/dL (ref 0.0–1.2)
Total Protein: 8.5 g/dL — ABNORMAL HIGH (ref 6.5–8.1)

## 2024-08-11 LAB — CBC WITH DIFFERENTIAL (CANCER CENTER ONLY)
Abs Immature Granulocytes: 0.06 10*3/uL (ref 0.00–0.07)
Basophils Absolute: 0 10*3/uL (ref 0.0–0.1)
Basophils Relative: 1 %
Eosinophils Absolute: 0.1 10*3/uL (ref 0.0–0.5)
Eosinophils Relative: 4 %
HCT: 30.3 % — ABNORMAL LOW (ref 36.0–46.0)
Hemoglobin: 10 g/dL — ABNORMAL LOW (ref 12.0–15.0)
Immature Granulocytes: 2 %
Lymphocytes Relative: 30 %
Lymphs Abs: 1 10*3/uL (ref 0.7–4.0)
MCH: 34.8 pg — ABNORMAL HIGH (ref 26.0–34.0)
MCHC: 33 g/dL (ref 30.0–36.0)
MCV: 105.6 fL — ABNORMAL HIGH (ref 80.0–100.0)
Monocytes Absolute: 0.5 10*3/uL (ref 0.1–1.0)
Monocytes Relative: 14 %
Neutro Abs: 1.7 10*3/uL (ref 1.7–7.7)
Neutrophils Relative %: 49 %
Platelet Count: 197 10*3/uL (ref 150–400)
RBC: 2.87 MIL/uL — ABNORMAL LOW (ref 3.87–5.11)
RDW: 14.2 % (ref 11.5–15.5)
WBC Count: 3.4 10*3/uL — ABNORMAL LOW (ref 4.0–10.5)
nRBC: 0 % (ref 0.0–0.2)

## 2024-08-12 LAB — KAPPA/LAMBDA LIGHT CHAINS
Kappa free light chain: 19.4 mg/L (ref 3.3–19.4)
Kappa, lambda light chain ratio: 0.06 — ABNORMAL LOW (ref 0.26–1.65)
Lambda free light chains: 317.3 mg/L — ABNORMAL HIGH (ref 5.7–26.3)

## 2024-08-26 ENCOUNTER — Ambulatory Visit: Admitting: Oncology

## 2024-10-06 ENCOUNTER — Ambulatory Visit: Admitting: Internal Medicine
